# Patient Record
Sex: Female | Born: 1960 | Race: White | Hispanic: No | Marital: Single | Smoking: Never smoker
Health system: Southern US, Community
[De-identification: ages and names within clinical notes are randomized; demographics above are authoritative.]

## PROBLEM LIST (undated history)

## (undated) DIAGNOSIS — I10 Essential (primary) hypertension: Secondary | ICD-10-CM

## (undated) DIAGNOSIS — E669 Obesity, unspecified: Secondary | ICD-10-CM

## (undated) DIAGNOSIS — K219 Gastro-esophageal reflux disease without esophagitis: Secondary | ICD-10-CM

## (undated) HISTORY — PX: MEDIAL PARTIAL KNEE REPLACEMENT: SHX5965

## (undated) HISTORY — PX: LAPAROSCOPIC GASTRIC BANDING: SHX1100

## (undated) HISTORY — DX: Obesity, unspecified: E66.9

---

## 2001-01-12 ENCOUNTER — Other Ambulatory Visit: Admission: RE | Admit: 2001-01-12 | Discharge: 2001-01-12 | Payer: Self-pay | Admitting: Family Medicine

## 2003-08-15 ENCOUNTER — Other Ambulatory Visit: Admission: RE | Admit: 2003-08-15 | Discharge: 2003-08-15 | Payer: Self-pay | Admitting: Family Medicine

## 2004-10-21 ENCOUNTER — Other Ambulatory Visit: Admission: RE | Admit: 2004-10-21 | Discharge: 2004-10-21 | Payer: Self-pay | Admitting: Family Medicine

## 2005-10-28 ENCOUNTER — Other Ambulatory Visit: Admission: RE | Admit: 2005-10-28 | Discharge: 2005-10-28 | Payer: Self-pay | Admitting: Family Medicine

## 2006-01-07 ENCOUNTER — Ambulatory Visit (HOSPITAL_COMMUNITY): Admission: RE | Admit: 2006-01-07 | Discharge: 2006-01-07 | Payer: Self-pay | Admitting: General Surgery

## 2006-01-12 ENCOUNTER — Encounter: Admission: RE | Admit: 2006-01-12 | Discharge: 2006-01-12 | Payer: Self-pay | Admitting: General Surgery

## 2006-01-23 ENCOUNTER — Ambulatory Visit (HOSPITAL_COMMUNITY): Admission: RE | Admit: 2006-01-23 | Discharge: 2006-01-23 | Payer: Self-pay | Admitting: General Surgery

## 2006-05-11 ENCOUNTER — Encounter: Admission: RE | Admit: 2006-05-11 | Discharge: 2006-08-09 | Payer: Self-pay | Admitting: General Surgery

## 2006-05-26 ENCOUNTER — Ambulatory Visit (HOSPITAL_COMMUNITY): Admission: RE | Admit: 2006-05-26 | Discharge: 2006-05-27 | Payer: Self-pay | Admitting: General Surgery

## 2006-08-25 ENCOUNTER — Encounter: Admission: RE | Admit: 2006-08-25 | Discharge: 2006-11-23 | Payer: Self-pay | Admitting: General Surgery

## 2006-11-24 ENCOUNTER — Other Ambulatory Visit: Admission: RE | Admit: 2006-11-24 | Discharge: 2006-11-24 | Payer: Self-pay | Admitting: Obstetrics and Gynecology

## 2007-06-09 ENCOUNTER — Encounter: Admission: RE | Admit: 2007-06-09 | Discharge: 2007-06-09 | Payer: Self-pay | Admitting: Family Medicine

## 2007-12-27 ENCOUNTER — Other Ambulatory Visit: Admission: RE | Admit: 2007-12-27 | Discharge: 2007-12-27 | Payer: Self-pay | Admitting: Family Medicine

## 2008-02-01 ENCOUNTER — Encounter: Admission: RE | Admit: 2008-02-01 | Discharge: 2008-02-01 | Payer: Self-pay | Admitting: Obstetrics and Gynecology

## 2008-02-04 ENCOUNTER — Encounter: Admission: RE | Admit: 2008-02-04 | Discharge: 2008-02-04 | Payer: Self-pay | Admitting: Interventional Radiology

## 2009-01-23 ENCOUNTER — Other Ambulatory Visit: Admission: RE | Admit: 2009-01-23 | Discharge: 2009-01-23 | Payer: Self-pay | Admitting: Obstetrics and Gynecology

## 2009-08-29 ENCOUNTER — Ambulatory Visit (HOSPITAL_COMMUNITY): Admission: RE | Admit: 2009-08-29 | Discharge: 2009-08-29 | Payer: Self-pay | Admitting: Interventional Radiology

## 2009-09-03 ENCOUNTER — Ambulatory Visit (HOSPITAL_COMMUNITY): Admission: RE | Admit: 2009-09-03 | Discharge: 2009-09-04 | Payer: Self-pay | Admitting: Interventional Radiology

## 2009-10-02 ENCOUNTER — Encounter: Admission: RE | Admit: 2009-10-02 | Discharge: 2009-10-02 | Payer: Self-pay | Admitting: Interventional Radiology

## 2010-09-01 ENCOUNTER — Encounter: Payer: Self-pay | Admitting: Family Medicine

## 2010-09-02 ENCOUNTER — Encounter: Payer: Self-pay | Admitting: Interventional Radiology

## 2010-09-20 ENCOUNTER — Other Ambulatory Visit: Payer: Self-pay | Admitting: Family Medicine

## 2010-09-20 DIAGNOSIS — Z1231 Encounter for screening mammogram for malignant neoplasm of breast: Secondary | ICD-10-CM

## 2010-10-10 ENCOUNTER — Ambulatory Visit
Admission: RE | Admit: 2010-10-10 | Discharge: 2010-10-10 | Disposition: A | Discharge status: Home / Self Care | Payer: BC Managed Care – PPO | Source: Ambulatory Visit | Attending: Family Medicine | Admitting: Family Medicine

## 2010-10-10 DIAGNOSIS — Z1231 Encounter for screening mammogram for malignant neoplasm of breast: Secondary | ICD-10-CM

## 2010-10-18 ENCOUNTER — Other Ambulatory Visit (HOSPITAL_COMMUNITY)
Admission: RE | Admit: 2010-10-18 | Discharge: 2010-10-18 | Disposition: A | Discharge status: Home / Self Care | Payer: BC Managed Care – PPO | Source: Ambulatory Visit | Attending: Obstetrics and Gynecology | Admitting: Obstetrics and Gynecology

## 2010-10-18 ENCOUNTER — Other Ambulatory Visit: Payer: Self-pay | Admitting: Obstetrics and Gynecology

## 2010-10-18 DIAGNOSIS — Z01419 Encounter for gynecological examination (general) (routine) without abnormal findings: Secondary | ICD-10-CM | POA: Insufficient documentation

## 2010-10-27 LAB — CREATININE, SERUM
Creatinine, Ser: 0.79 mg/dL (ref 0.4–1.2)
GFR calc Af Amer: >60 mL/min (ref >60)
GFR calc non Af Amer: >60 mL/min (ref >60)

## 2010-10-27 LAB — CBC
Hemoglobin: 12.2 g/dL (ref 12.0–15.0)
Platelets: 310 10*3/uL (ref 150–400)
RDW: 15 % (ref 11.5–15.5)

## 2010-12-27 NOTE — Op Note (Signed)
[name], [name] NO.:  [id number]   MEDICAL RECORD NO.:  [id number]          PATIENT TYPE:  OIB   LOCATION:  1505                         FACILITY:  [facility]   PHYSICIAN:  Sharlet Salina T. Hoxworth, M.D.DATE OF BIRTH:  [birth year]   DATE OF PROCEDURE:  05/26/2006  DATE OF DISCHARGE:                               OPERATIVE REPORT   PRE AND POSTOPERATIVE DIAGNOSIS:  Morbid obesity.   SURGICAL PROCEDURES:  Placement laparoscopic adjustable gastric bands.   SURGEON:  Lorne Skeens. Hoxworth, M.D.   ASSISTANT:  Dr. Baruch Merl   ANESTHESIA:  General.   BRIEF HISTORY:  Michaela Kennedy is a 42 old female with progressive morbid  obesity unresponsive to medical management and several comorbidities  including hypertension, chronic joint pain, possible early diabetes.  Following extensive preoperative evaluation and discussion detailed  elsewhere, we have elected to proceed with placement laparoscopic  adjustable gastric band.  She is brought to the operating room for this  procedure.   DESCRIPTION OF PROCEDURE:  The patient was brought to the operating room  placed supine position operating table and general orotracheal  anesthesia was induced.  She received preoperative IV heparin.  PAS were  placed.  The abdomen was widely sterilely prepped and draped.  She  received preoperative IV antibiotics.  Correct patient and procedure  were verified.  Access was obtained without difficulty with a 11 mm  OptiVu trocar in the left subcostal space and pneumoperitoneum  established.  Under direct vision a 15 mm trocar was placed in the right  subxiphoid area and 11 mm trocar in the right midabdomen, 11 mm trocar  in the left abdomen just left to midline for the camera port and a 5 mm  trocar in the left flank.  Through a 5-mm site in the subxiphoid area,  the Nathanson retractor was placed and left lobe of liver elevated with  excellent exposure of the upper abdomen and hiatus.  The  angle of His  was exposed and the peritoneum overlying the left crus was incised and  dissection carefully carried down along the left crus toward the  retrogastric area bluntly with the finger dissector.  Following this the  gastrohepatic omentum was opened in an avascular space and the right  crus identified and a point along its medial edge that was crossing fat  was incised with hook cautery and careful blunt dissection of the finger  dissector was used to enter the retrogastric space and the finger  dissection deployed up to the previously dissected area of the angle of  His without difficulty.  A flushed APS lap band system was introduced  through the 15 mm trocar which had been placed with finger tractor which  was then brought back behind the stomach and the band was brought back  behind the stomach without difficulty.  With the calibration tube in  place, the tubing was placed through the band and the band buckled  without undue tightness.  The calibration tube was removed.  The fundus  beginning near the angle of his was imbricated up over the band to  the  small gastric pouch with three interrupted 2-0 Ethibond sutures.  The  band was seen to rotate without undue tension following this with the  buckle well away from the imbrication.  Following this the liver  retractor was removed.  The tubing brought out through the right mid  abdominal trocar site.  All CO2 evacuated.  Trocars removed.  The  incision in the right mid abdominal trocar site was extended somewhat  laterally.  The anterior fascia exposed and four 2-0 Prolene sutures  placed.  The tubing was cut, the port attached and then sutured to the  anterior abdominal wall with the previously placed sutures.  The tubing  was seen to curve nicely through the  trocar site.  The wounds were irrigated.  The subcu was closed at the  port site with running 2-0 Vicryl.  Skin was closed with staples.  Sponge, needle and instrument  counts were correct.  Dry sterile  dressings were applied.  The patient taken to recovery in good  condition.      Lorne Skeens. Hoxworth, M.D.  Electronically Signed     BTH/MEDQ  D:  05/26/2006  T:  05/27/2006  Job:  [id number]

## 2011-01-29 ENCOUNTER — Emergency Department (INDEPENDENT_AMBULATORY_CARE_PROVIDER_SITE_OTHER): Payer: BC Managed Care – PPO

## 2011-01-29 ENCOUNTER — Emergency Department (HOSPITAL_BASED_OUTPATIENT_CLINIC_OR_DEPARTMENT_OTHER)
Admission: EM | Admit: 2011-01-29 | Discharge: 2011-01-29 | Disposition: A | Discharge status: Home / Self Care | Payer: BC Managed Care – PPO | Attending: Emergency Medicine | Admitting: Emergency Medicine

## 2011-01-29 DIAGNOSIS — R7309 Other abnormal glucose: Secondary | ICD-10-CM | POA: Insufficient documentation

## 2011-01-29 DIAGNOSIS — R509 Fever, unspecified: Secondary | ICD-10-CM | POA: Insufficient documentation

## 2011-01-29 DIAGNOSIS — I10 Essential (primary) hypertension: Secondary | ICD-10-CM | POA: Insufficient documentation

## 2011-01-29 LAB — COMPREHENSIVE METABOLIC PANEL
ALT: 9 U/L (ref 0–35)
AST: 12 U/L (ref 0–37)
Calcium: 8.8 mg/dL (ref 8.4–10.5)
Creatinine, Ser: 0.7 mg/dL (ref 0.50–1.10)
GFR calc Af Amer: >60 mL/min (ref >60)
Glucose, Bld: 180 mg/dL — ABNORMAL HIGH (ref 70–99)
Sodium: 135 mEq/L (ref 135–145)
Total Protein: 7 g/dL (ref 6.0–8.3)

## 2011-01-29 LAB — WET PREP, GENITAL
Clue Cells Wet Prep HPF POC: NONE SEEN
Trich, Wet Prep: NONE SEEN
Yeast Wet Prep HPF POC: NONE SEEN

## 2011-01-29 LAB — URINALYSIS, ROUTINE W REFLEX MICROSCOPIC
Ketones, ur: NEGATIVE mg/dL
Leukocytes, UA: NEGATIVE
Protein, ur: NEGATIVE mg/dL
Urobilinogen, UA: 0.2 mg/dL (ref 0.0–1.0)

## 2011-01-29 LAB — DIFFERENTIAL
Basophils Absolute: 0 10*3/uL (ref 0.0–0.1)
Basophils Relative: 0 % (ref 0–1)
Eosinophils Absolute: 0.1 10*3/uL (ref 0.0–0.7)
Eosinophils Relative: 1 % (ref 0–5)
Monocytes Absolute: 0.8 10*3/uL (ref 0.1–1.0)
Neutro Abs: 7.9 10*3/uL — ABNORMAL HIGH (ref 1.7–7.7)

## 2011-01-29 LAB — CBC
MCHC: 34.1 g/dL (ref 30.0–36.0)
RDW: 12.5 % (ref 11.5–15.5)

## 2011-01-30 LAB — GC/CHLAMYDIA PROBE AMP, GENITAL: Chlamydia, DNA Probe: NEGATIVE

## 2011-02-03 ENCOUNTER — Emergency Department (HOSPITAL_BASED_OUTPATIENT_CLINIC_OR_DEPARTMENT_OTHER)
Admission: EM | Admit: 2011-02-03 | Discharge: 2011-02-03 | Disposition: A | Discharge status: Home / Self Care | Payer: BC Managed Care – PPO | Attending: Emergency Medicine | Admitting: Emergency Medicine

## 2011-02-03 DIAGNOSIS — R509 Fever, unspecified: Secondary | ICD-10-CM | POA: Insufficient documentation

## 2011-02-03 DIAGNOSIS — I10 Essential (primary) hypertension: Secondary | ICD-10-CM | POA: Insufficient documentation

## 2011-02-03 LAB — URINALYSIS, ROUTINE W REFLEX MICROSCOPIC
Glucose, UA: NEGATIVE mg/dL
Hgb urine dipstick: NEGATIVE
Ketones, ur: NEGATIVE mg/dL
Protein, ur: NEGATIVE mg/dL
pH: 5.5 (ref 5.0–8.0)

## 2011-02-03 LAB — URINE MICROSCOPIC-ADD ON

## 2011-02-05 LAB — CULTURE, BLOOD (ROUTINE X 2)
Culture  Setup Time: 201206210225
Culture: NO GROWTH

## 2011-02-05 LAB — ROCKY MTN SPOTTED FVR AB, IGG-BLOOD: RMSF IgG: 0.7 IV

## 2011-02-05 LAB — ROCKY MTN SPOTTED FVR AB, IGM-BLOOD: RMSF IgM: 0.12 IV (ref 0.00–0.89)

## 2011-02-14 ENCOUNTER — Emergency Department (HOSPITAL_BASED_OUTPATIENT_CLINIC_OR_DEPARTMENT_OTHER)
Admission: EM | Admit: 2011-02-14 | Discharge: 2011-02-14 | Disposition: A | Discharge status: Home / Self Care | Payer: BC Managed Care – PPO | Attending: Emergency Medicine | Admitting: Emergency Medicine

## 2011-02-14 DIAGNOSIS — T783XXA Angioneurotic edema, initial encounter: Secondary | ICD-10-CM | POA: Insufficient documentation

## 2011-02-14 DIAGNOSIS — D649 Anemia, unspecified: Secondary | ICD-10-CM | POA: Insufficient documentation

## 2011-02-14 DIAGNOSIS — X58XXXA Exposure to other specified factors, initial encounter: Secondary | ICD-10-CM | POA: Insufficient documentation

## 2011-02-14 DIAGNOSIS — I10 Essential (primary) hypertension: Secondary | ICD-10-CM | POA: Insufficient documentation

## 2011-02-14 LAB — CBC
HCT: 27 % — ABNORMAL LOW (ref 36.0–46.0)
Hemoglobin: 9.2 g/dL — ABNORMAL LOW (ref 12.0–15.0)
MCHC: 34.1 g/dL (ref 30.0–36.0)
RDW: 12.6 % (ref 11.5–15.5)
WBC: 10 10*3/uL (ref 4.0–10.5)

## 2011-02-14 LAB — BASIC METABOLIC PANEL
Chloride: 96 mEq/L (ref 96–112)
GFR calc Af Amer: >60 mL/min (ref >60)
GFR calc non Af Amer: >60 mL/min (ref >60)
Potassium: 3.6 mEq/L (ref 3.5–5.1)
Sodium: 133 mEq/L — ABNORMAL LOW (ref 135–145)

## 2011-02-23 ENCOUNTER — Encounter (HOSPITAL_BASED_OUTPATIENT_CLINIC_OR_DEPARTMENT_OTHER): Payer: Self-pay | Admitting: Emergency Medicine

## 2011-02-23 ENCOUNTER — Emergency Department (HOSPITAL_BASED_OUTPATIENT_CLINIC_OR_DEPARTMENT_OTHER)
Admission: EM | Admit: 2011-02-23 | Discharge: 2011-02-23 | Disposition: A | Discharge status: Home / Self Care | Payer: BC Managed Care – PPO | Attending: Emergency Medicine | Admitting: Emergency Medicine

## 2011-02-23 ENCOUNTER — Emergency Department (HOSPITAL_BASED_OUTPATIENT_CLINIC_OR_DEPARTMENT_OTHER): Payer: BC Managed Care – PPO

## 2011-02-23 DIAGNOSIS — N9489 Other specified conditions associated with female genital organs and menstrual cycle: Secondary | ICD-10-CM | POA: Insufficient documentation

## 2011-02-23 DIAGNOSIS — B9689 Other specified bacterial agents as the cause of diseases classified elsewhere: Secondary | ICD-10-CM | POA: Insufficient documentation

## 2011-02-23 DIAGNOSIS — A499 Bacterial infection, unspecified: Secondary | ICD-10-CM | POA: Insufficient documentation

## 2011-02-23 DIAGNOSIS — I10 Essential (primary) hypertension: Secondary | ICD-10-CM | POA: Insufficient documentation

## 2011-02-23 DIAGNOSIS — N760 Acute vaginitis: Secondary | ICD-10-CM | POA: Insufficient documentation

## 2011-02-23 DIAGNOSIS — N898 Other specified noninflammatory disorders of vagina: Secondary | ICD-10-CM

## 2011-02-23 DIAGNOSIS — N390 Urinary tract infection, site not specified: Secondary | ICD-10-CM

## 2011-02-23 DIAGNOSIS — R109 Unspecified abdominal pain: Secondary | ICD-10-CM | POA: Insufficient documentation

## 2011-02-23 HISTORY — DX: Essential (primary) hypertension: I10

## 2011-02-23 LAB — URINALYSIS, ROUTINE W REFLEX MICROSCOPIC
Nitrite: NEGATIVE
Protein, ur: 30 mg/dL — AB
Specific Gravity, Urine: 1.029 (ref 1.005–1.030)
Urobilinogen, UA: 1 mg/dL (ref 0.0–1.0)

## 2011-02-23 LAB — URINE MICROSCOPIC-ADD ON

## 2011-02-23 LAB — WET PREP, GENITAL

## 2011-02-23 MED ORDER — ACETAMINOPHEN 500 MG PO TABS
1000.0000 mg | ORAL_TABLET | Freq: Once | ORAL | Status: AC
  Administered 2011-02-23: 1000 mg via ORAL

## 2011-02-23 MED ORDER — CIPROFLOXACIN HCL 500 MG PO TABS: 500.0000 mg | ORAL_TABLET | Freq: Two times a day (BID) | ORAL | Status: AC

## 2011-02-23 MED ORDER — ACETAMINOPHEN 500 MG PO TABS
ORAL_TABLET | ORAL | Status: AC
  Administered 2011-02-23: 1000 mg via ORAL
  Filled 2011-02-23: qty 2

## 2011-02-23 MED ORDER — METRONIDAZOLE 500 MG PO TABS: 500.0000 mg | ORAL_TABLET | Freq: Two times a day (BID) | ORAL | Status: AC

## 2011-02-23 NOTE — ED Notes (Signed)
PA & MD unable to collect GC/chlamydia.  Wet Prep collected and sent to lab.

## 2011-02-23 NOTE — ED Notes (Signed)
Pt is requesting tylenol for pain.  Provider notified.

## 2011-02-23 NOTE — ED Notes (Signed)
Pt informed that she may get dressed, discharge pending

## 2011-02-23 NOTE — ED Notes (Signed)
PA to do pelvic on Pt. With assisstance from EMT Nicholos Johns

## 2011-02-23 NOTE — ED Notes (Signed)
PCP Sherrine Maples, NP

## 2011-02-24 ENCOUNTER — Other Ambulatory Visit (HOSPITAL_BASED_OUTPATIENT_CLINIC_OR_DEPARTMENT_OTHER): Payer: BC Managed Care – PPO

## 2011-02-24 ENCOUNTER — Ambulatory Visit (HOSPITAL_BASED_OUTPATIENT_CLINIC_OR_DEPARTMENT_OTHER): Admit: 2011-02-24 | Payer: BC Managed Care – PPO

## 2011-02-24 ENCOUNTER — Ambulatory Visit (HOSPITAL_BASED_OUTPATIENT_CLINIC_OR_DEPARTMENT_OTHER): Payer: BC Managed Care – PPO

## 2011-02-24 ENCOUNTER — Ambulatory Visit (HOSPITAL_BASED_OUTPATIENT_CLINIC_OR_DEPARTMENT_OTHER): Admission: RE | Admit: 2011-02-24 | Payer: BC Managed Care – PPO | Source: Ambulatory Visit

## 2011-02-24 NOTE — ED Provider Notes (Signed)
History   HPI Patient is a 50 y.o. female presenting with abdominal pain.  Abdominal Pain The primary symptoms of the illness include abdominal pain. The primary symptoms of the illness do not include fever, nausea, vomiting, diarrhea, dysuria, vaginal discharge or vaginal bleeding. Primary symptoms comment: vaginal mass  Symptoms associated with the illness do not include anorexia, constipation, urgency, hematuria, frequency or back pain.  Pt describes pain as cramping. States pain is constant. Reports today a mass came out of her vagina. States she tried to pull it out but it was stuck. States mass and vagina are not tender. Denies vaginal bleeding.   Past Medical History  Diagnosis Date  . Hypertension     Past Surgical History  Procedure Date  . Laparoscopic gastric banding     No family history on file.  History  Substance Use Topics  . Smoking status: Never Smoker   . Smokeless tobacco: Not on file  . Alcohol Use: Yes     occ    OB History    Grav Para Term Preterm Abortions TAB SAB Ect Mult Living                  Review of Systems  Constitutional: Negative for fever.  Gastrointestinal: Positive for abdominal pain. Negative for nausea, vomiting, diarrhea, constipation and anorexia.       Vaginal mass   Genitourinary: Negative for dysuria, urgency, frequency, hematuria, vaginal bleeding and vaginal discharge.  Musculoskeletal: Negative for back pain.    Physical Exam  BP 152/86  Pulse 90  Temp(Src) 98.6 F (37 C) (Oral)  Resp 20  SpO2 97%  LMP 01/19/2011  Physical Exam  Constitutional: She is oriented to person, place, and time. She appears well-developed and well-nourished.  HENT:  Head: Atraumatic.  Eyes: Conjunctivae and EOM are normal. Pupils are equal, round, and reactive to light.  Neck: Normal range of motion. Neck supple.  Cardiovascular: Normal rate, regular rhythm and normal heart sounds.  Exam reveals no friction rub.   No murmur  heard. Pulmonary/Chest: Effort normal and breath sounds normal. She has no wheezes. She has no rales. She exhibits no tenderness.  Abdominal: Soft. Bowel sounds are normal. She exhibits no distension and no mass. There is no tenderness. There is no rebound and no guarding.  Genitourinary:    There is no tenderness or lesion on the right labia. There is no tenderness or lesion on the left labia. No tenderness around the vagina.       Large soft tissue mass protruding from within the vagina. Mass is pale, nontender. Unable to removed with ring forceps. Unable to visualize cervix due to size of mass. Mass is not a vaginal or rectal prolapse. Does not apear to be a fibroid.  Musculoskeletal: Normal range of motion.  Neurological: She is alert and oriented to person, place, and time. Coordination normal.  Skin: Skin is warm and dry. No rash noted. No erythema. No pallor.    ED Course  Pelvic exam Performed by: Thomasene Lot Authorized by: Thomasene Lot Consent: Verbal consent obtained. Consent given by: patient Patient understanding: patient states understanding of the procedure being performed Time out: Immediately prior to procedure a "time out" was called to verify the correct patient, procedure, equipment, support staff and site/side marked as required. Preparation: Patient was prepped and draped in the usual sterile fashion. Local anesthesia used: no Patient sedated: no Patient tolerance: Patient tolerated the procedure well with no immediate complications. Comments: Unable to  visualize the cervix due to large vaginal mass. Mass is unattached to vaginal walls. Potentially a pedunculated. No vaginal discharge or bleeding. External genitalia normal.     MDM Scheduled an outpatient [place] for further evaluation of mass. Suspect a pedunculated fibroid may have come out of the uterus. Therefore causing abdominal cramping. Pelvic exam on 01/29/2011 was did not indicate a vaginal mass. DDx  also include a cancerous tumor. I have strongly advised for close follow-up with her OB/GYN. Patient agrees with plan. [place] tech has spoken with her and patient is ready for discharge.       Thomasene Lot, [place] 02/26/11 1104

## 2011-02-25 ENCOUNTER — Other Ambulatory Visit: Payer: Self-pay | Admitting: Obstetrics and Gynecology

## 2011-02-25 ENCOUNTER — Encounter (HOSPITAL_COMMUNITY): Payer: Self-pay | Admitting: *Deleted

## 2011-02-25 ENCOUNTER — Ambulatory Visit (HOSPITAL_COMMUNITY): Payer: BC Managed Care – PPO | Admitting: Anesthesiology

## 2011-02-25 ENCOUNTER — Encounter (HOSPITAL_COMMUNITY): Payer: Self-pay | Admitting: Anesthesiology

## 2011-02-25 ENCOUNTER — Encounter (HOSPITAL_COMMUNITY)
Admission: RE | Disposition: A | Discharge status: Home / Self Care | Payer: Self-pay | Source: Ambulatory Visit | Attending: Obstetrics and Gynecology

## 2011-02-25 ENCOUNTER — Inpatient Hospital Stay (HOSPITAL_COMMUNITY)
Admission: RE | Admit: 2011-02-25 | Discharge: 2011-02-27 | DRG: 359 | Disposition: A | Discharge status: Home / Self Care | Payer: BC Managed Care – PPO | Source: Ambulatory Visit | Attending: Obstetrics and Gynecology | Admitting: Obstetrics and Gynecology

## 2011-02-25 DIAGNOSIS — D259 Leiomyoma of uterus, unspecified: Principal | ICD-10-CM

## 2011-02-25 DIAGNOSIS — I10 Essential (primary) hypertension: Secondary | ICD-10-CM

## 2011-02-25 DIAGNOSIS — IMO0002 Reserved for concepts with insufficient information to code with codable children: Secondary | ICD-10-CM | POA: Diagnosis not present

## 2011-02-25 DIAGNOSIS — Z90710 Acquired absence of both cervix and uterus: Secondary | ICD-10-CM

## 2011-02-25 DIAGNOSIS — Y9210 Unspecified residential institution as the place of occurrence of the external cause: Secondary | ICD-10-CM | POA: Diagnosis not present

## 2011-02-25 HISTORY — PX: ABDOMINAL HYSTERECTOMY: SHX81

## 2011-02-25 LAB — CBC
MCHC: 32.4 g/dL (ref 30.0–36.0)
MCV: 87.9 fL (ref 78.0–100.0)
Platelets: 342 10*3/uL (ref 150–400)
RDW: 15.5 % (ref 11.5–15.5)
WBC: 7.2 10*3/uL (ref 4.0–10.5)

## 2011-02-25 LAB — URINE MICROSCOPIC-ADD ON

## 2011-02-25 LAB — URINALYSIS, ROUTINE W REFLEX MICROSCOPIC
Bilirubin Urine: NEGATIVE
Ketones, ur: NEGATIVE mg/dL
Nitrite: NEGATIVE
Protein, ur: NEGATIVE mg/dL
Specific Gravity, Urine: 1.02 (ref 1.005–1.030)
Urobilinogen, UA: 0.2 mg/dL (ref 0.0–1.0)

## 2011-02-25 LAB — SURGICAL PCR SCREEN
MRSA, PCR: NEGATIVE
Staphylococcus aureus: NEGATIVE

## 2011-02-25 LAB — URINE CULTURE: Culture  Setup Time: 201207152052

## 2011-02-25 LAB — ABO/RH: ABO/RH(D): O POS

## 2011-02-25 SURGERY — HYSTERECTOMY, ABDOMINAL
Anesthesia: Choice | Site: Abdomen | Wound class: Clean Contaminated

## 2011-02-25 MED ORDER — PROMETHAZINE HCL 25 MG/ML IJ SOLN: 12.5000 mg | INTRAMUSCULAR | Status: DC | PRN

## 2011-02-25 MED ORDER — MIDAZOLAM HCL 5 MG/5ML IJ SOLN
INTRAMUSCULAR | Status: DC | PRN
  Administered 2011-02-25: 2 mg via INTRAVENOUS

## 2011-02-25 MED ORDER — MIDAZOLAM HCL 2 MG/2ML IJ SOLN
INTRAMUSCULAR | Status: AC
  Filled 2011-02-25: qty 2

## 2011-02-25 MED ORDER — LIDOCAINE HCL (CARDIAC) 20 MG/ML IV SOLN
INTRAVENOUS | Status: DC | PRN
  Administered 2011-02-25: 100 mg via INTRAVENOUS

## 2011-02-25 MED ORDER — FENTANYL CITRATE 0.05 MG/ML IJ SOLN
INTRAMUSCULAR | Status: AC
  Filled 2011-02-25: qty 5

## 2011-02-25 MED ORDER — CEFAZOLIN SODIUM-DEXTROSE 2-3 GM-% IV SOLR: 2.0000 g | INTRAVENOUS | Status: DC

## 2011-02-25 MED ORDER — FENTANYL CITRATE 0.05 MG/ML IJ SOLN
INTRAMUSCULAR | Status: DC | PRN
  Administered 2011-02-25: 50 ug
  Administered 2011-02-25: 100 ug via INTRAVENOUS

## 2011-02-25 MED ORDER — LIDOCAINE HCL (CARDIAC) 20 MG/ML IV SOLN
INTRAVENOUS | Status: AC
  Filled 2011-02-25: qty 5

## 2011-02-25 MED ORDER — INDIGOTINDISULFONATE SODIUM 8 MG/ML IJ SOLN
INTRAMUSCULAR | Status: DC | PRN
  Administered 2011-02-25: 5 mL via INTRAVENOUS

## 2011-02-25 MED ORDER — ESMOLOL HCL 10 MG/ML IV SOLN
INTRAVENOUS | Status: DC | PRN
  Administered 2011-02-25 (×5): 20 mg via INTRAVENOUS

## 2011-02-25 MED ORDER — HYDROMORPHONE HCL 1 MG/ML IJ SOLN
INTRAMUSCULAR | Status: AC
  Administered 2011-02-25: 0.5 mg via INTRAVENOUS
  Filled 2011-02-25: qty 1

## 2011-02-25 MED ORDER — GLYCOPYRROLATE 0.2 MG/ML IJ SOLN
INTRAMUSCULAR | Status: DC | PRN
  Administered 2011-02-25: .8 mg via INTRAVENOUS

## 2011-02-25 MED ORDER — HYDROMORPHONE HCL 1 MG/ML IJ SOLN
INTRAMUSCULAR | Status: DC | PRN
  Administered 2011-02-25 (×2): 1 mg via INTRAVENOUS

## 2011-02-25 MED ORDER — STERILE WATER FOR IRRIGATION IR SOLN
Status: DC | PRN
  Administered 2011-02-25: 1000 mL

## 2011-02-25 MED ORDER — ONDANSETRON HCL 4 MG/2ML IJ SOLN
INTRAMUSCULAR | Status: AC
  Filled 2011-02-25: qty 2

## 2011-02-25 MED ORDER — HYDROMORPHONE HCL 1 MG/ML IJ SOLN
0.2500 mg | INTRAMUSCULAR | Status: DC | PRN
  Administered 2011-02-25 (×2): 0.5 mg via INTRAVENOUS

## 2011-02-25 MED ORDER — ROCURONIUM BROMIDE 50 MG/5ML IV SOLN
INTRAVENOUS | Status: AC
  Filled 2011-02-25: qty 1

## 2011-02-25 MED ORDER — HYDROMORPHONE HCL 1 MG/ML IJ SOLN
INTRAMUSCULAR | Status: AC
  Filled 2011-02-25: qty 1

## 2011-02-25 MED ORDER — ESMOLOL HCL 10 MG/ML IV SOLN
INTRAVENOUS | Status: AC
  Filled 2011-02-25: qty 10

## 2011-02-25 MED ORDER — LACTATED RINGERS IV SOLN
INTRAVENOUS | Status: DC
  Administered 2011-02-25 (×4): via INTRAVENOUS

## 2011-02-25 MED ORDER — PROPOFOL 10 MG/ML IV EMUL
INTRAVENOUS | Status: DC | PRN
  Administered 2011-02-25: 200 mg via INTRAVENOUS

## 2011-02-25 MED ORDER — LACTATED RINGERS IV SOLN
INTRAVENOUS | Status: DC
  Administered 2011-02-26: 01:00:00 via INTRAVENOUS

## 2011-02-25 MED ORDER — NEOSTIGMINE METHYLSULFATE 1 MG/ML IJ SOLN
INTRAMUSCULAR | Status: AC
  Filled 2011-02-25: qty 10

## 2011-02-25 MED ORDER — CEFAZOLIN SODIUM-DEXTROSE 2-3 GM-% IV SOLR
2.0000 g | INTRAVENOUS | Status: AC
  Administered 2011-02-25: 2 g via INTRAVENOUS
  Filled 2011-02-25: qty 50

## 2011-02-25 MED ORDER — FENTANYL CITRATE 0.05 MG/ML IJ SOLN
INTRAMUSCULAR | Status: AC
  Administered 2011-02-25: 50 ug
  Filled 2011-02-25: qty 2

## 2011-02-25 MED ORDER — OXYCODONE-ACETAMINOPHEN 5-325 MG PO TABS
1.0000 | ORAL_TABLET | ORAL | Status: DC | PRN
  Administered 2011-02-26: 1 via ORAL
  Administered 2011-02-26: 2 via ORAL
  Administered 2011-02-26 (×3): 1 via ORAL
  Administered 2011-02-27 (×2): 2 via ORAL
  Filled 2011-02-25 (×2): qty 2
  Filled 2011-02-25: qty 1
  Filled 2011-02-25: qty 2
  Filled 2011-02-25 (×3): qty 1

## 2011-02-25 MED ORDER — HYDROMORPHONE 0.3 MG/ML IV SOLN
INTRAVENOUS | Status: AC
  Filled 2011-02-25: qty 25

## 2011-02-25 MED ORDER — DEXAMETHASONE SODIUM PHOSPHATE 10 MG/ML IJ SOLN
INTRAMUSCULAR | Status: AC
  Filled 2011-02-25: qty 1

## 2011-02-25 MED ORDER — INDIGOTINDISULFONATE SODIUM 8 MG/ML IJ SOLN
INTRAMUSCULAR | Status: AC
  Filled 2011-02-25: qty 5

## 2011-02-25 MED ORDER — MICROFIBRILLAR COLL HEMOSTAT EX POWD
CUTANEOUS | Status: DC | PRN
  Administered 2011-02-25: 1 g via TOPICAL

## 2011-02-25 MED ORDER — SENNOSIDES-DOCUSATE SODIUM 8.6-50 MG PO TABS: 2.0000 | ORAL_TABLET | Freq: Every day | ORAL | Status: DC | PRN

## 2011-02-25 MED ORDER — HYDROMORPHONE 0.3 MG/ML IV SOLN
INTRAVENOUS | Status: DC
  Administered 2011-02-25: 22:00:00 via INTRAVENOUS
  Administered 2011-02-26: 3 mg via INTRAVENOUS
  Administered 2011-02-26: 04:00:00 via INTRAVENOUS
  Administered 2011-02-26: 3 via INTRAVENOUS

## 2011-02-25 MED ORDER — FENTANYL CITRATE 0.05 MG/ML IJ SOLN
INTRAMUSCULAR | Status: AC
  Filled 2011-02-25: qty 2

## 2011-02-25 MED ORDER — IBUPROFEN 200 MG PO TABS: 200.0000 mg | ORAL_TABLET | Freq: Four times a day (QID) | ORAL | Status: DC | PRN

## 2011-02-25 MED ORDER — PROPOFOL 10 MG/ML IV EMUL
INTRAVENOUS | Status: AC
  Filled 2011-02-25: qty 20

## 2011-02-25 MED ORDER — DEXAMETHASONE SODIUM PHOSPHATE 10 MG/ML IJ SOLN
INTRAMUSCULAR | Status: DC | PRN
  Administered 2011-02-25: 10 mg via INTRAVENOUS

## 2011-02-25 MED ORDER — ROCURONIUM BROMIDE 100 MG/10ML IV SOLN
INTRAVENOUS | Status: DC | PRN
  Administered 2011-02-25: 5 mg via INTRAVENOUS
  Administered 2011-02-25: 10 mg via INTRAVENOUS
  Administered 2011-02-25: 5 mg via INTRAVENOUS
  Administered 2011-02-25: 50 mg via INTRAVENOUS

## 2011-02-25 MED ORDER — MUPIROCIN 2 % EX OINT
TOPICAL_OINTMENT | CUTANEOUS | Status: AC
  Filled 2011-02-25: qty 22

## 2011-02-25 MED ORDER — NEOSTIGMINE METHYLSULFATE 1 MG/ML IJ SOLN
INTRAMUSCULAR | Status: DC | PRN
  Administered 2011-02-25: 4 mg via INTRAMUSCULAR

## 2011-02-25 MED ORDER — GLYCOPYRROLATE 0.2 MG/ML IJ SOLN
INTRAMUSCULAR | Status: AC
  Filled 2011-02-25: qty 2

## 2011-02-25 MED ORDER — ONDANSETRON HCL 4 MG/2ML IJ SOLN
INTRAMUSCULAR | Status: DC | PRN
  Administered 2011-02-25: 4 mg via INTRAVENOUS

## 2011-02-25 MED ORDER — ONDANSETRON HCL 4 MG/2ML IJ SOLN: 4.0000 mg | Freq: Once | INTRAMUSCULAR | Status: DC | PRN

## 2011-02-25 MED ORDER — IBUPROFEN 600 MG PO TABS
600.0000 mg | ORAL_TABLET | Freq: Four times a day (QID) | ORAL | Status: DC | PRN
  Administered 2011-02-26 (×2): 600 mg via ORAL
  Filled 2011-02-25 (×2): qty 1

## 2011-02-25 MED ORDER — KETOROLAC TROMETHAMINE 30 MG/ML IJ SOLN: 15.0000 mg | Freq: Once | INTRAMUSCULAR | Status: DC | PRN

## 2011-02-25 MED ORDER — MEPERIDINE HCL 25 MG/ML IJ SOLN: 6.2500 mg | INTRAMUSCULAR | Status: DC | PRN

## 2011-02-25 MED ORDER — FENTANYL CITRATE 0.05 MG/ML IJ SOLN
INTRAMUSCULAR | Status: DC | PRN
  Administered 2011-02-25: 150 ug via INTRAVENOUS
  Administered 2011-02-25: 100 ug via INTRAVENOUS
  Administered 2011-02-25: 150 ug via INTRAVENOUS
  Administered 2011-02-25: 100 ug via INTRAVENOUS

## 2011-02-25 SURGICAL SUPPLY — 40 items
APPLICATOR COTTON TIP 6IN STRL (MISCELLANEOUS) ×1 IMPLANT
CANISTER SUCTION 2500CC (MISCELLANEOUS) ×2 IMPLANT
CLOTH BEACON ORANGE TIMEOUT ST (SAFETY) ×2 IMPLANT
CONT PATH 16OZ SNAP LID 3702 (MISCELLANEOUS) ×2 IMPLANT
DECANTER SPIKE VIAL GLASS SM (MISCELLANEOUS) IMPLANT
DRAPE CAMERA CLOSED 9X96 (DRAPES) ×1 IMPLANT
DRAPE UTILITY XL STRL (DRAPES) ×1 IMPLANT
GAUZE SPONGE 4X4 16PLY XRAY LF (GAUZE/BANDAGES/DRESSINGS) ×2 IMPLANT
GLOVE BIOGEL M 6.5 STRL (GLOVE) ×2 IMPLANT
GLOVE BIOGEL PI IND STRL 6.5 (GLOVE) ×2 IMPLANT
GLOVE BIOGEL PI INDICATOR 6.5 (GLOVE) ×2
GOWN BRE IMP SLV AUR LG STRL (GOWN DISPOSABLE) ×5 IMPLANT
GOWN BRE IMP SLV AUR XL STRL (GOWN DISPOSABLE) ×2 IMPLANT
NDL HYPO 25X1 1.5 SAFETY (NEEDLE) IMPLANT
NEEDLE HYPO 25X1 1.5 SAFETY (NEEDLE) IMPLANT
NS IRRIG 1000ML POUR BTL (IV SOLUTION) ×2 IMPLANT
PACK ABDOMINAL GYN (CUSTOM PROCEDURE TRAY) ×2 IMPLANT
PAD OB MATERNITY 4.3X12.25 (PERSONAL CARE ITEMS) ×2 IMPLANT
SET CYSTO W/LG BORE CLAMP LF (SET/KITS/TRAYS/PACK) ×1 IMPLANT
SPONGE LAP 18X18 X RAY DECT (DISPOSABLE) ×6 IMPLANT
STAPLER VISISTAT 35W (STAPLE) IMPLANT
SUT PDS AB 0 CT1 27 (SUTURE) ×6 IMPLANT
SUT PDS AB 0 CTX 60 (SUTURE) ×2 IMPLANT
SUT PDS AB 1 CTX 36 (SUTURE) IMPLANT
SUT VIC AB 0 CT1 18XCR BRD8 (SUTURE) IMPLANT
SUT VIC AB 0 CT1 27 (SUTURE)
SUT VIC AB 0 CT1 27XCR 8 STRN (SUTURE) IMPLANT
SUT VIC AB 0 CT1 36 (SUTURE) ×4 IMPLANT
SUT VIC AB 0 CT1 8-18 (SUTURE) ×4
SUT VIC AB 2-0 CT1 (SUTURE) IMPLANT
SUT VIC AB 2-0 CT1 27 (SUTURE) ×4
SUT VIC AB 2-0 CT1 TAPERPNT 27 (SUTURE) ×2 IMPLANT
SUT VIC AB 2-0 SH 27 (SUTURE) ×4
SUT VIC AB 2-0 SH 27XBRD (SUTURE) ×2 IMPLANT
SUT VIC AB 4-0 KS 27 (SUTURE) ×2 IMPLANT
SUT VICRYL 0 TIES 12 18 (SUTURE) ×2 IMPLANT
SYR CONTROL 10ML LL (SYRINGE) IMPLANT
TOWEL OR 17X24 6PK STRL BLUE (TOWEL DISPOSABLE) ×4 IMPLANT
TRAY FOLEY CATH 14FR (SET/KITS/TRAYS/PACK) ×2 IMPLANT
WATER STERILE IRR 1000ML POUR (IV SOLUTION) ×1 IMPLANT

## 2011-02-25 NOTE — Anesthesia Preprocedure Evaluation (Addendum)
Anesthesia Evaluation  General Assessment Comment  Reviewed: Allergy & Precautions, H&P , Patient's Chart, lab work & pertinent test results and reviewed documented beta blocker date and time   Airway Mallampati: I TM Distance: >3 FB Neck ROM: full    Dental No notable dental hx (+) Teeth Intact   Pulmonaryneg pulmonary ROS    clear to auscultation    Cardiovascular regular Normal   Neuro/PsychNegative Neurological ROS Negative Psych ROS  GI/Hepatic/Renal negative GI ROS, negative Liver ROS, and negative Renal ROS (+)       Endo/Other  Negative Endocrine ROS (+)   Abdominal   Musculoskeletal  Hematology negative hematology ROS (+)   Peds  Reproductive/Obstetrics negative OB ROS   Anesthesia Other Findings             Anesthesia Physical Anesthesia Plan  ASA: II  Anesthesia Plan: General   Post-op Pain Management:    Induction:   Airway Management Planned: Oral ETT  Additional Equipment:   Intra-op Plan:   Post-operative Plan: Extubation in OR  Informed Consent: I have reviewed the patients History and Physical, chart, labs and discussed the procedure including the risks, benefits and alternatives for the proposed anesthesia with the patient or authorized representative who has indicated his/her understanding and acceptance.   Dental Advisory Given and History available from chart only  Plan Discussed with: CRNA  Anesthesia Plan Comments:       Anesthesia Quick Evaluation

## 2011-02-25 NOTE — Op Note (Signed)
Hysterectomy Procedure Note  Indications: Prolapsed uterine fibroids    Pre-operative Diagnosis: 1 prolapsed uterine fibroids 2 anemia   Post-operative Diagnosis: same + 3 cystotomy    Operation: Supracervical abdominal hysterectomy, cystotomy repair and cystoscopy     Surgeon: Gerald Leitz J.   Assistants: Consuelo Pandy    Anesthesia: General endotracheal anesthesia  ASA Class:   Procedure Details  The patient was seen in the Holding Room. The risks, benefits, complications, treatment options, and expected outcomes were discussed with the patient.  The patient concurred with the proposed plan, giving informed consent.  The site of surgery properly noted/marked. The patient was taken to Operating Room # 3, identified as Michaela Kennedy and the procedure verified as Total abdominal hysterectomy. A Time Out was held and the above information confirmed.  After induction of anesthesia, the patient was draped and prepped in the usual sterile manner. Pt was placed in supine position after anesthesia and draped and prepped in the usual sterile manner. Foley catheter was placed.  A midline  incision was made and carried through the subcutaneous tissue to the fascia. Fascial incision was made and extended anteriorly and inferiorly. . The rectus muscles were separated.  What was thought to be the peritoneum was identified and entered sharply. Once entry was made this was discovered to actually be the bladder that was displaced anteriorly.. The cystotomy was repaired with 2.0 vicryl in a running fashion . A second layer of suture was used in a runing locked fashion.  The peritoneum was then identified grasped with 2 hemostats and entered sharply.   The findings below  were noted.  A balfor retractor was placed and bowel was packed away from the surgical site.   The round ligaments were identified, cut, and ligated with 0-Vicryl. The anterior peritoneal reflection was incised and the bladder was  dissected off the lower uterine segment. The retroperitoneal space was explored and the ureters were identified bilaterally. The right utero-ovarian ligament and proximal fallopian tube were grasped, cut, and suture ligated with 0-Vicryl. The left utero-ovarian ligament and proximal fallopian tube were grasped, cut and suture ligated with 0-Vicryl. Hemostasis  was observed. The uterine vessels were skeletonized, then clamped, cut and suture ligated with 0-Vicryl suture. Serial pedicles of the cardinalwere clamped, cut, and suture ligated with 0-Vicryl. Do to the prolapsing fibroid and dilated cervix a decision was made to excise the uterus from the cervix.  The uterus and fibroids were sent to pathology. The cerivical stump was reaaproximated with interrupted figure of eight sutures of 0-vicryl.  Lavage was carried out until clear. Hemostasis was observed. avatene was placed over the cervical stump.  Cystoscopy was performed with 70 degree scope.Larena Glassman carmine was expressed from both ureteral orifices. The cystoscope was then removed.   Attention was then returned to the abdomen wher all  Packing retractors and instruments were removed   The fascia was approximated with running sutures of double stranded 0 pds. Lavage was again carried out... The subcutaneous adipose tissue was reapproximate with 2-0 plain gut.  Hemostasis was observed. The skin was approximated with staples.  Instrument, sponge, and needle counts were correct prior to abdominal closure and at the conclusion of the case.   Findings: Multiple uterine fibroids.. Large prolapsed fibroid.. Dilated cervix. Normal fallopian tubes and ovaries... Estimated Blood Loss:  250 cc         Drains: foley          Total IV Fluids: 3200 ml  Specimens: uterus and fibroids          Implants none           Complications: cystotomy         Disposition: PACU - hemodynamically stable.         Condition: stable  Attending Attestation: I  performed the procedure.

## 2011-02-25 NOTE — Anesthesia Postprocedure Evaluation (Signed)
Vital signs stable Patient alert Pain and nausea are controlled No apparent anesthetic complications No follow up care needed 

## 2011-02-25 NOTE — Anesthesia Procedure Notes (Signed)
Procedures

## 2011-02-25 NOTE — Transfer of Care (Signed)
Immediate Anesthesia Transfer of Care Note  Patient: Michaela Kennedy  Procedure(s) Performed:  HYSTERECTOMY ABDOMINAL - Supercervical Hysterectomy with Cystoscopy  Patient Location: PACU  Anesthesia Type: General  Level of Consciousness: awake, alert  and oriented  Airway & Oxygen Therapy: Patient Spontanous Breathing and Patient connected to nasal cannula oxygen  Post-op Assessment: Report given to PACU RN, Post -op Vital signs reviewed and stable and Patient moving all extremities  Post vital signs: Reviewed and stable  Complications: No apparent anesthesia complications

## 2011-02-26 ENCOUNTER — Other Ambulatory Visit (HOSPITAL_COMMUNITY): Payer: BC Managed Care – PPO

## 2011-02-26 LAB — BASIC METABOLIC PANEL
CO2: 27 mEq/L (ref 19–32)
Chloride: 99 mEq/L (ref 96–112)
Glucose, Bld: 135 mg/dL — ABNORMAL HIGH (ref 70–99)
Potassium: 5.1 mEq/L (ref 3.5–5.1)
Sodium: 132 mEq/L — ABNORMAL LOW (ref 135–145)

## 2011-02-26 LAB — CBC
HCT: 28.4 % — ABNORMAL LOW (ref 36.0–46.0)
Hemoglobin: 9 g/dL — ABNORMAL LOW (ref 12.0–15.0)
MCH: 28 pg (ref 26.0–34.0)
MCV: 88.5 fL (ref 78.0–100.0)
RBC: 3.21 MIL/uL — ABNORMAL LOW (ref 3.87–5.11)

## 2011-02-26 MED ORDER — HYDROMORPHONE 0.3 MG/ML IV SOLN
INTRAVENOUS | Status: AC
  Filled 2011-02-26: qty 25

## 2011-02-26 MED ORDER — FERROUS SULFATE 325 (65 FE) MG PO TABS
325.0000 mg | ORAL_TABLET | Freq: Three times a day (TID) | ORAL | Status: DC
  Administered 2011-02-26 – 2011-02-27 (×3): 325 mg via ORAL
  Filled 2011-02-26 (×3): qty 1

## 2011-02-26 MED ORDER — HYDROCHLOROTHIAZIDE 25 MG PO TABS
25.0000 mg | ORAL_TABLET | Freq: Every day | ORAL | Status: DC
  Administered 2011-02-26 – 2011-02-27 (×2): 25 mg via ORAL
  Filled 2011-02-26 (×2): qty 1

## 2011-02-26 NOTE — Progress Notes (Signed)
UR chart review completed.  

## 2011-02-26 NOTE — Progress Notes (Signed)
Subjective: Patient reports tolerating PO. No nausea or emesis. No flatus    Objective: I have reviewed patient's vital signs, intake and output and labs.  General: alert, cooperative and no distress GI: soft, non-tender; bowel sounds normal; no masses,  no organomegaly and incision: clean, dry and intact Extremities: no edema, redness or tenderness in the calves or thighs   Assessment/Plan: Pod # 1 s/p supracervical hysterectomy/ cystotomy/ cystoscopy doing well  Saline lock iv hctz for hypertension  Encourage ambulation Start po pain medication   LOS: 1 day    Elric Tirado J. 02/26/2011, 8:34 AM

## 2011-02-27 MED ORDER — IBUPROFEN 600 MG PO TABS: 600.0000 mg | ORAL_TABLET | Freq: Four times a day (QID) | ORAL | Status: AC | PRN

## 2011-02-27 MED ORDER — OXYCODONE-ACETAMINOPHEN 5-325 MG PO TABS: 1.0000 | ORAL_TABLET | Freq: Four times a day (QID) | ORAL | Status: AC | PRN

## 2011-02-27 NOTE — Discharge Summary (Signed)
Physician Discharge Summary  Patient ID: Michaela Kennedy MRN: [id number] DOB/AGE: April 16, 1961 50 y.o.  Admit date: 02/25/2011 Discharge date: 02/27/2011  Admission Diagnoses:  Discharge Diagnoses:  Active Problems:  * No active hospital problems. *    Discharged Condition: good  Hospital Course:  Pt admited with prolapsing uterine fibroids and underwent supracervical hysterectomy/ cystotomy repair/ cystoscopy on 02/25/2011   Consults: none  Significant Diagnostic Studies:   Treatments: surgery: supracervical hyterectomy/ cystotomy/ cystoscopy   Discharge Exam: Blood pressure 148/94, pulse 71, temperature 98.7 F (37.1 C), temperature source Oral, resp. rate 18, height 5\' 4"  (1.626 m), weight 95.255 kg (210 lb), last menstrual period 01/19/2011, SpO2 96.00%. General appearance: alert and cooperative GI: normal findings: soft appropriately tender  nondistended + bs  Extremities: no edema, redness or tenderness in the calves or thighs  Disposition: Home or Self Care  Discharge Orders    Future Orders Please Complete By Expires   Diet - low sodium heart healthy      Discharge instructions      Discharge wound care:      Increase activity slowly      Discharge wound care:      Comments:   Keep incision clean and dry   Call MD for:  temperature >100.4      Call MD for:  severe uncontrolled pain      Call MD for:  persistant nausea and vomiting      Call MD for:  redness, tenderness, or signs of infection (pain, swelling, redness, odor or green/yellow discharge around incision site)        Current Discharge Medication List    START taking these medications   Details  ibuprofen (ADVIL,MOTRIN) 600 MG tablet Take 1 tablet (600 mg total) by mouth every 6 (six) hours as needed for pain (mild pain). Qty: 30 tablet, Refills: 1    oxyCODONE-acetaminophen (PERCOCET) 5-325 MG per tablet Take 1-2 tablets by mouth every 6 (six) hours as needed (moderate to severe pain (when tolerating  fluids)). Qty: 30 tablet, Refills: 0      CONTINUE these medications which have NOT CHANGED   Details  ciprofloxacin (CIPRO) 500 MG tablet Take 1 tablet (500 mg total) by mouth 2 (two) times daily. Qty: 6 tablet, Refills: 0    Cyanocobalamin (B-12) 500 MCG SUBL Place 500 mcg under the tongue.      cyclobenzaprine (FLEXERIL) 10 MG tablet Take 10 mg by mouth 2 (two) times daily as needed. For muscle spasms     Ferrous Sulfate (IRON SUPPLEMENT PO) Take 1 tablet by mouth daily.      hydrochlorothiazide 25 MG tablet Take 25 mg by mouth daily.      metroNIDAZOLE (FLAGYL) 500 MG tablet Take 1 tablet (500 mg total) by mouth 2 (two) times daily. Qty: 14 tablet, Refills: 0      STOP taking these medications     acetaminophen (TYLENOL) 500 MG tablet      predniSONE (DELTASONE) 50 MG tablet      norgestimate-ethinyl estradiol (ORTHO-CYCLEN) 0.25-35 MG-MCG per tablet        Follow-up Information    Follow up with Youlanda Tomassetti J.. Call on 03/04/2011. [phone].. Call for appt time for staple removal and  foley catherter removal )    Contact information:   [address] [place] [address] [phone]          Signed: Jessee Avers. 02/27/2011, 7:14 AM

## 2011-02-27 NOTE — Progress Notes (Signed)
Pt reports flatus.. Pain controlled no nausea or vomiting. Tolerating po  AF bp slightly elevated. See vitals Abdomen; soft appropriately tender nondistended + bs Incision CDI Ext no edema A/p POD #2 supracervical hysterectomy/ cystotomy repair/ cystoscopy  D/c home with foley .Marland Kitchen  Pt to continue cipro  Plan for foley removal in 1 wk

## 2011-02-27 NOTE — Plan of Care (Signed)
Problem: Phase III Progression Outcomes Goal: Remove staples if indicated/incision care Outcome: Not Applicable Date Met:  02/27/11 Patient to have staples removed at F/U visit

## 2011-03-04 NOTE — ED Provider Notes (Signed)
Medical screening examination/treatment/procedure(s) were performed by non-physician practitioner and as supervising physician I was immediately available for consultation/collaboration.  Riley Lam [facility] 03/04/11 0719

## 2011-03-13 ENCOUNTER — Encounter (HOSPITAL_COMMUNITY): Payer: Self-pay | Admitting: Obstetrics and Gynecology

## 2011-05-02 ENCOUNTER — Encounter (INDEPENDENT_AMBULATORY_CARE_PROVIDER_SITE_OTHER): Payer: BC Managed Care – PPO

## 2011-05-05 ENCOUNTER — Encounter: Payer: Self-pay | Admitting: *Deleted

## 2011-05-05 ENCOUNTER — Encounter: Payer: BC Managed Care – PPO | Attending: General Surgery | Admitting: *Deleted

## 2011-05-05 ENCOUNTER — Other Ambulatory Visit (INDEPENDENT_AMBULATORY_CARE_PROVIDER_SITE_OTHER): Payer: Self-pay

## 2011-05-05 DIAGNOSIS — Z09 Encounter for follow-up examination after completed treatment for conditions other than malignant neoplasm: Secondary | ICD-10-CM | POA: Insufficient documentation

## 2011-05-05 DIAGNOSIS — Z9884 Bariatric surgery status: Secondary | ICD-10-CM | POA: Insufficient documentation

## 2011-05-05 DIAGNOSIS — Z713 Dietary counseling and surveillance: Secondary | ICD-10-CM | POA: Insufficient documentation

## 2011-05-05 NOTE — Progress Notes (Signed)
  Follow-up visit: 5 Years Post-Operative LAGB Surgery  Medical Nutrition Therapy:  Appt start time: 1000 end time:  1100.  Assessment:  Primary concerns today: post-operative bariatric surgery nutrition management. Pt had a LAGB placed by Dr. Johna Sheriff on 05/26/2006. Her last weight was 204.9 (3 months post-op) on 08/25/06. Pt reports that during the past year she has regained a lot of weight due to eating too late, making poor food choices (pizza, chocolate, wine), and limited activity. Pt is here today to get back on track with eating. She recently met her insurance deducible so she is utilizing the opportunity to see the nutritionis/MDt. She wants to inquire on a special diet plan today. She states that she does not want a band fill because she has had some negative experiences with vomiting and food getting stuck yet at the same time she does not want to be able to "eat everything in sight". Pt reports that "I do not chew my food well". Pt is to see PA, Lenard Forth, on Friday yet is unsure if she will want a fill.   Weight today: 219.3 lbs Weight change: 14.4 lbs gain since last visit  Total weight lost: 26.8lbs BMI: 37.7 Weight goal: 170 lbs Lowest weight: 170 lbs (2008)  24-hr recall:  B (8:30-9 AM): 1 hard boiled egg OR scrambled egg Snk (10:30  AM): [place] yogurt w/ fruit, 2 T. granola  L (12-1 PM): 3/4 Salad from [employer] (200 calories), Light dressing OR Lean [place]ine Snk (3-4 PM): Cheese stick OR Yogurt cup OR Almonds  D (6-9 PM): Stir fry (chicken, vegetables, light sauce) OR Chili Snk (PM): N/A  Fluid intake: water w/ lemon, diet soda, Sweet tea, milk,  occasional wine intake Estimated total protein intake: 40-50g   Medications: On HTN medications Supplementation: Taking B12 and Iron  Using straws: No Drinking while eating: No Hair loss: None reported Carbonated beverages: Yes, Diet Soda (2-3 times/week) N/V/D/C: No Last Lap-Band fill: Pt has not had a band fill in over  a year  Recent physical activity:  Pt is 6 weeks s/p Hysterectomy. She is 3 weeks into her regular exercise patterns. She plays lifts weights, works out in gym, and plays Tennis, 3 times/week for 60 minutes+  Samples given: [employer] (20g/serving) Vanilla: [id number] / Exp 1/14 Chocolate: [id number] / Exp 1/14 Unflavored: [id number] / Exp 8/13  Progress Towards Goal(s):  In progress.  Handouts given during visit include:  Pre-Op Diet  Carbohydrate Counting Handout   Nutritional Diagnosis:  [place]-3.4 Unintentional weight gain As related to large portions of chocolate, wine, and carbohydrate rich foods.  As evidenced by pt with a a 50 lbs weight gain over the past 3 years s/p LAGB.    Intervention:  Nutrition education.  Monitoring/Evaluation:  Dietary intake, exercise, lap band fills, and body weight. Follow up in 1 months for 5 year post-op visit.

## 2011-05-05 NOTE — Patient Instructions (Addendum)
Goals:  Restart Pre-Op Diet Plan  Eat 3-6 small meals/snacks, every 3-5 hrs  Increase lean protein foods to meet 60-80g goal  Increase fluid intake to 64oz +  Consume < 15 grams of carbohydrate (fruit, whole grain, starchy vegetable) with meals as needed  Avoid drinking 15 minutes before, during and 30 minutes after eating  Continue to aim for >30 min of physical activity daily

## 2011-05-09 ENCOUNTER — Encounter (INDEPENDENT_AMBULATORY_CARE_PROVIDER_SITE_OTHER): Payer: BC Managed Care – PPO

## 2011-06-04 ENCOUNTER — Ambulatory Visit: Payer: BC Managed Care – PPO | Admitting: *Deleted

## 2011-06-17 ENCOUNTER — Ambulatory Visit: Payer: BC Managed Care – PPO | Admitting: *Deleted

## 2011-07-11 ENCOUNTER — Encounter (INDEPENDENT_AMBULATORY_CARE_PROVIDER_SITE_OTHER): Payer: BC Managed Care – PPO

## 2011-07-25 ENCOUNTER — Encounter (INDEPENDENT_AMBULATORY_CARE_PROVIDER_SITE_OTHER): Payer: BC Managed Care – PPO

## 2012-11-08 ENCOUNTER — Other Ambulatory Visit: Payer: Self-pay

## 2012-11-08 DIAGNOSIS — Z1231 Encounter for screening mammogram for malignant neoplasm of breast: Secondary | ICD-10-CM

## 2012-12-06 ENCOUNTER — Ambulatory Visit
Admission: RE | Admit: 2012-12-06 | Discharge: 2012-12-06 | Disposition: A | Discharge status: Home / Self Care | Payer: 59 | Source: Ambulatory Visit

## 2012-12-06 DIAGNOSIS — Z1231 Encounter for screening mammogram for malignant neoplasm of breast: Secondary | ICD-10-CM

## 2012-12-22 ENCOUNTER — Other Ambulatory Visit: Payer: Self-pay | Admitting: Family Medicine

## 2012-12-22 DIAGNOSIS — M545 Low back pain: Secondary | ICD-10-CM

## 2012-12-25 ENCOUNTER — Other Ambulatory Visit: Payer: 59

## 2012-12-26 ENCOUNTER — Ambulatory Visit
Admission: RE | Admit: 2012-12-26 | Discharge: 2012-12-26 | Disposition: A | Discharge status: Home / Self Care | Payer: 59 | Source: Ambulatory Visit | Attending: Family Medicine | Admitting: Family Medicine

## 2012-12-26 DIAGNOSIS — M545 Low back pain, unspecified: Secondary | ICD-10-CM

## 2014-01-17 ENCOUNTER — Other Ambulatory Visit: Payer: Self-pay

## 2014-01-17 DIAGNOSIS — Z1231 Encounter for screening mammogram for malignant neoplasm of breast: Secondary | ICD-10-CM

## 2014-01-24 ENCOUNTER — Encounter (INDEPENDENT_AMBULATORY_CARE_PROVIDER_SITE_OTHER): Payer: Self-pay

## 2014-01-24 ENCOUNTER — Ambulatory Visit
Admission: RE | Admit: 2014-01-24 | Discharge: 2014-01-24 | Disposition: A | Discharge status: Home / Self Care | Payer: 59 | Source: Ambulatory Visit

## 2014-01-24 DIAGNOSIS — Z1231 Encounter for screening mammogram for malignant neoplasm of breast: Secondary | ICD-10-CM

## 2014-05-25 ENCOUNTER — Encounter: Payer: 59 | Attending: Family Medicine | Admitting: Dietician

## 2014-05-25 VITALS — Ht 64.0 in | Wt 237.6 lb

## 2014-05-25 DIAGNOSIS — Z713 Dietary counseling and surveillance: Secondary | ICD-10-CM | POA: Diagnosis not present

## 2014-05-25 DIAGNOSIS — Z6841 Body Mass Index (BMI) 40.0 and over, adult: Secondary | ICD-10-CM | POA: Diagnosis not present

## 2014-05-25 DIAGNOSIS — E669 Obesity, unspecified: Secondary | ICD-10-CM | POA: Diagnosis present

## 2014-05-25 NOTE — Patient Instructions (Addendum)
Goal weight: 180 lbs  -Healthy weight loss is 1-2 pounds per week  Revamp shakes!  -Use less fruit - limit it to 1/2 a cup (use apples and berries)  -Spinach  -Low carb protein powder  -Unsweetened milk  -Unsweetened cocoa powder  -PB2  -Protein is the priority  -Consider seeing Dr. Excell Seltzer to get a band fill  -Remove trigger foods from the home  -Take time to prepare healthy foods  -Avoid drinking while eating  -Wait 15 minutes before and 30 minutes after eating  -Have something with protein to eat every 3-5 hours -Stash healthy snacks at work  -Increase physical activity when possible  -Walking, water aerobics, tennis, [employer] fitness classes

## 2014-05-25 NOTE — Progress Notes (Signed)
  Medical Nutrition Therapy:  Appt start time: 3212 end time:  1500.   Assessment:  Primary concerns today: Michaela Kennedy is here today to discuss her weight. She just had a knee replacement and hasn't done any exercise. She reports she has gained 30 pounds since April. She underwent LAGB in 2007 and has regained most of the weight she lost after surgery. She has not gotten a Lap Band fill in years and is not interested in a revision. Has some restriction and cannot eat much at one time. She reports that her goal weight is 180 lbs. She lives with her 21 year old son and ex husband.  Preferred Learning Style:   No preference indicated   Learning Readiness:   Contemplating  Ready   MEDICATIONS: see list   DIETARY INTAKE:  24-hr recall:  B ( AM): Boiled eggs or 1 packet of grits or bison meatloaf or shake with spinach and fruit  Snk ( AM):   L ( PM): salad or tuna/egg salad Snk ( PM): celery and carrots with hummus OR chips and salsa D ( PM): chili or sometimes pizza Snk ( PM): chips and salsa with cheese  Beverages: coffee, wine almost every night, water, occasionally soda  Usual physical activity: physical therapy 1x a week  Estimated energy needs: 1000-1200 calories   Progress Towards Goal(s):  In progress.   Nutritional Diagnosis:  Ducor-3.3 Overweight/obesity related to past poor dietary habits and physical inactivity as evidenced by patient w/ history of LAGB surgery following dietary guidelines for continued weight loss.     Intervention:  Nutrition counseling provided.  Goals:  Goal weight: 180 lbs  -Healthy weight loss is 1-2 pounds per week  Revamp shakes!  -Use less fruit - limit it to 1/2 a cup (use apples and berries)  -Spinach  -Low carb protein powder  -Unsweetened milk  -Unsweetened cocoa powder  -PB2  -Protein is the priority -Consider seeing Dr. Excell Seltzer to get a band fill -Remove trigger foods from the home -Take time to prepare healthy foods -Avoid  drinking while eating  -Wait 15 minutes before and 30 minutes after eating -Have something with protein to eat every 3-5 hours -Stash healthy snacks at work -Increase physical activity when possible  -Walking, water aerobics, tennis, [employer] fitness classes  Teaching Method Utilized:  Ship broker  Handouts given during visit include:  Phase 3A lean protein foods  Protein shakes  High fiber food list  Barriers to learning/adherence to lifestyle change: insufficient restriction with Lap Band  Demonstrated degree of understanding via:  Teach Back   Monitoring/Evaluation:  Dietary intake, exercise, lap band fills, and body weight in 2 month(s).

## 2014-05-26 ENCOUNTER — Encounter: Payer: Self-pay | Admitting: Dietician

## 2014-07-18 ENCOUNTER — Ambulatory Visit: Payer: 59 | Admitting: Dietician

## 2014-08-07 ENCOUNTER — Institutional Professional Consult (permissible substitution): Payer: 59 | Admitting: Pulmonary Disease

## 2015-02-01 ENCOUNTER — Emergency Department (HOSPITAL_BASED_OUTPATIENT_CLINIC_OR_DEPARTMENT_OTHER)
Admission: EM | Admit: 2015-02-01 | Discharge: 2015-02-01 | Disposition: A | Discharge status: Home / Self Care | Payer: 59 | Attending: Emergency Medicine | Admitting: Emergency Medicine

## 2015-02-01 ENCOUNTER — Encounter (HOSPITAL_BASED_OUTPATIENT_CLINIC_OR_DEPARTMENT_OTHER): Payer: Self-pay | Admitting: *Deleted

## 2015-02-01 DIAGNOSIS — I10 Essential (primary) hypertension: Secondary | ICD-10-CM | POA: Diagnosis present

## 2015-02-01 DIAGNOSIS — E669 Obesity, unspecified: Secondary | ICD-10-CM | POA: Diagnosis not present

## 2015-02-01 DIAGNOSIS — Z79899 Other long term (current) drug therapy: Secondary | ICD-10-CM | POA: Diagnosis not present

## 2015-02-01 DIAGNOSIS — R0602 Shortness of breath: Secondary | ICD-10-CM | POA: Insufficient documentation

## 2015-02-01 NOTE — ED Notes (Addendum)
Pt up at desk stating she is having anxiety regarding blood draw and would like to follow up with pcp.  MD notified and will be in to speak with pt momentarily.

## 2015-02-01 NOTE — ED Notes (Signed)
Attempted to draw pt's blood. She became very anxious and asked RN to stop. Asked if we could try again in a few minutes. Will try again at 2:15.

## 2015-02-01 NOTE — ED Notes (Signed)
Pt c/o increased BP x 2 days also states SOB x 1 hr

## 2015-02-01 NOTE — ED Provider Notes (Signed)
CSN: [id number]     Arrival date & time 02/01/15  1314 History   First MD Initiated Contact with Patient 02/01/15 1334     Chief Complaint  Patient presents with  . Hypertension     (Consider location/radiation/quality/duration/timing/severity/associated sxs/prior Treatment) HPI Comments: Patient is a 54 year old female who presents with complaints of elevated blood pressure for the past 2 days. She states she has felt somewhat weak. She has been checking her blood pressures and they have been increasing with each measurement. Today at work she became concerned about this and became short of breath. She denies any chest pain. Shortly after triage, she began to feel much improved. She has no complaints headache, nausea, fever, or other complaints.  Patient is a 54 y.o. female presenting with hypertension. The history is provided by the patient.  Hypertension This is a new problem. The current episode started 2 days ago. The problem occurs constantly. The problem has been gradually worsening. Associated symptoms include shortness of breath. Pertinent negatives include no chest pain. Nothing aggravates the symptoms. Nothing relieves the symptoms. She has tried nothing for the symptoms. The treatment provided no relief.    Past Medical History  Diagnosis Date  . Hypertension   . Obesity    Past Surgical History  Procedure Laterality Date  . Laparoscopic gastric banding    . Abdominal hysterectomy  02/25/2011    Procedure: HYSTERECTOMY ABDOMINAL;  Surgeon: Catha Brow;  Location: [facility];  Service: Gynecology;  Laterality: N/A;  Supercervical Hysterectomy with Cystoscopy  . Medial partial knee replacement     Family History  Problem Relation Age of Onset  . Sleep apnea Other   . Obesity Other   . Hypertension Other    History  Substance Use Topics  . Smoking status: Never Smoker   . Smokeless tobacco: Not on file  . Alcohol Use: Yes     Comment: occ   OB History    No data  available     Review of Systems  Respiratory: Positive for shortness of breath.   Cardiovascular: Negative for chest pain.  All other systems reviewed and are negative.     Allergies  Lisinopril  Home Medications   Prior to Admission medications   Medication Sig Start Date End Date Taking? Authorizing Provider  amLODipine (NORVASC) 5 MG tablet Take 5 mg by mouth daily.      Historical Provider, MD  bisoprolol-hydrochlorothiazide [facility]) 10-6.25 MG per tablet Take 1 tablet by mouth daily.    Historical Provider, MD  Cyanocobalamin (B-12) 500 MCG SUBL Place 500 mcg under the tongue.      Historical Provider, MD  cyclobenzaprine (FLEXERIL) 10 MG tablet Take 10 mg by mouth 2 (two) times daily as needed. For muscle spasms     Historical Provider, MD  Ferrous Sulfate (IRON SUPPLEMENT PO) Take 1 tablet by mouth daily.      Historical Provider, MD  hydrochlorothiazide 25 MG tablet Take 25 mg by mouth daily.      Historical Provider, MD   BP 211/98 mmHg  Pulse 20  Temp(Src) 98.2 F (36.8 C)  Resp 16  Ht 5\' 4"  (1.626 m)  Wt 225 lb (102.059 kg)  BMI 38.60 kg/m2  SpO2 98%  LMP 01/19/2011 Physical Exam  Constitutional: She is oriented to person, place, and time. She appears well-developed and well-nourished. No distress.  HENT:  Head: Normocephalic and atraumatic.  Mouth/Throat: Oropharynx is clear and moist.  Eyes: EOM are normal. Pupils  are equal, round, and reactive to light.  Neck: Normal range of motion. Neck supple.  Cardiovascular: Normal rate and regular rhythm.  Exam reveals no gallop and no friction rub.   No murmur heard. Pulmonary/Chest: Effort normal and breath sounds normal. No respiratory distress. She has no wheezes.  Abdominal: Soft. Bowel sounds are normal. She exhibits no distension. There is no tenderness.  Musculoskeletal: Normal range of motion.  Neurological: She is alert and oriented to person, place, and time. No cranial nerve deficit. She exhibits normal  muscle tone. Coordination normal.  Skin: Skin is warm and dry. She is not diaphoretic.  Nursing note and vitals reviewed.   ED Course  Procedures (including critical care time) Labs Review Labs Reviewed  BASIC METABOLIC PANEL  CBC WITH DIFFERENTIAL/PLATELET  TROPONIN I    Imaging Review No results found.   EKG Interpretation   Date/Time:  Thursday February 01 2015 13:51:04 EDT Ventricular Rate:  53 PR Interval:  190 QRS Duration: 102 QT Interval:  504 QTC Calculation: 472 R Axis:   29 Text Interpretation:  Sinus bradycardia Otherwise normal ECG Confirmed by  Charliegh Vasudevan  MD, Cathe Bilger ([id number]) on 02/01/2015 1:52:59 PM      MDM   Final diagnoses:  None    Patient presents here with complaints of elevated blood pressure. She reports weakness and fatigue but denies any chest pain or shortness of breath. My plan was to perform a workup with blood work and EKG. Her EKG was performed and is unremarkable. While attempting to draw blood the patient became very anxious and requested that this be stopped. She is now requesting to be discharged and wants to follow-up with her primary doctor. Her blood pressure at one point was 140s over 70s. After the blood draw and is again 188/105 and I suspect this is related to anxiety. She will be discharged to home. I will advise her to increase her Norvasc to 10 mg daily and keep a record of her blood pressures. She has an appointment with her doctor on Wednesday and can discuss her blood pressures with them at this time.    Veryl Speak, MD 02/01/15 [phone]

## 2015-02-01 NOTE — ED Notes (Signed)
Pt eloped. UTO ama signature.

## 2015-02-01 NOTE — Discharge Instructions (Signed)
Increase your Norvasc to 10 mg daily.  Keep a record of your blood pressures and take this with you to your next doctor's appointment.   Hypertension Hypertension, commonly called high blood pressure, is when the force of blood pumping through your arteries is too strong. Your arteries are the blood vessels that carry blood from your heart throughout your body. A blood pressure reading consists of a higher number over a lower number, such as 110/72. The higher number (systolic) is the pressure inside your arteries when your heart pumps. The lower number (diastolic) is the pressure inside your arteries when your heart relaxes. Ideally you want your blood pressure below 120/80. Hypertension forces your heart to work harder to pump blood. Your arteries may become narrow or stiff. Having hypertension puts you at risk for heart disease, stroke, and other problems.  RISK FACTORS Some risk factors for high blood pressure are controllable. Others are not.  Risk factors you cannot control include:   Race. You may be at higher risk if you are African American.  Age. Risk increases with age.  Gender. Men are at higher risk than women before age 23 years. After age 24, women are at higher risk than men. Risk factors you can control include:  Not getting enough exercise or physical activity.  Being overweight.  Getting too much fat, sugar, calories, or salt in your diet.  Drinking too much alcohol. SIGNS AND SYMPTOMS Hypertension does not usually cause signs or symptoms. Extremely high blood pressure (hypertensive crisis) may cause headache, anxiety, shortness of breath, and nosebleed. DIAGNOSIS  To check if you have hypertension, your health care provider will measure your blood pressure while you are seated, with your arm held at the level of your heart. It should be measured at least twice using the same arm. Certain conditions can cause a difference in blood pressure between your right and left  arms. A blood pressure reading that is higher than normal on one occasion does not mean that you need treatment. If one blood pressure reading is high, ask your health care provider about having it checked again. TREATMENT  Treating high blood pressure includes making lifestyle changes and possibly taking medicine. Living a healthy lifestyle can help lower high blood pressure. You may need to change some of your habits. Lifestyle changes may include:  Following the DASH diet. This diet is high in fruits, vegetables, and whole grains. It is low in salt, red meat, and added sugars.  Getting at least 2 hours of brisk physical activity every week.  Losing weight if necessary.  Not smoking.  Limiting alcoholic beverages.  Learning ways to reduce stress. If lifestyle changes are not enough to get your blood pressure under control, your health care provider may prescribe medicine. You may need to take more than one. Work closely with your health care provider to understand the risks and benefits. HOME CARE INSTRUCTIONS  Have your blood pressure rechecked as directed by your health care provider.   Take medicines only as directed by your health care provider. Follow the directions carefully. Blood pressure medicines must be taken as prescribed. The medicine does not work as well when you skip doses. Skipping doses also puts you at risk for problems.   Do not smoke.   Monitor your blood pressure at home as directed by your health care provider. SEEK MEDICAL CARE IF:   You think you are having a reaction to medicines taken.  You have recurrent headaches or feel dizzy.  You have swelling in your ankles.  You have trouble with your vision. SEEK IMMEDIATE MEDICAL CARE IF:  You develop a severe headache or confusion.  You have unusual weakness, numbness, or feel faint.  You have severe chest or abdominal pain.  You vomit repeatedly.  You have trouble breathing. MAKE SURE YOU:     Understand these instructions.  Will watch your condition.  Will get help right away if you are not doing well or get worse. Document Released: 07/28/2005 Document Revised: 12/12/2013 Document Reviewed: 05/20/2013 [facility] Patient Information 2015 [place], [place]. This information is not intended to replace advice given to you by your health care provider. Make sure you discuss any questions you have with your health care provider.

## 2015-02-01 NOTE — ED Notes (Signed)
MD at bedside. 

## 2015-02-27 ENCOUNTER — Other Ambulatory Visit: Payer: Self-pay

## 2015-02-27 DIAGNOSIS — Z1231 Encounter for screening mammogram for malignant neoplasm of breast: Secondary | ICD-10-CM

## 2015-03-06 ENCOUNTER — Ambulatory Visit
Admission: RE | Admit: 2015-03-06 | Discharge: 2015-03-06 | Disposition: A | Discharge status: Home / Self Care | Payer: 59 | Source: Ambulatory Visit

## 2015-03-06 DIAGNOSIS — Z1231 Encounter for screening mammogram for malignant neoplasm of breast: Secondary | ICD-10-CM

## 2015-12-05 ENCOUNTER — Encounter: Payer: Self-pay | Admitting: Pulmonary Disease

## 2015-12-05 ENCOUNTER — Ambulatory Visit (INDEPENDENT_AMBULATORY_CARE_PROVIDER_SITE_OTHER): Payer: BLUE CROSS/BLUE SHIELD | Admitting: Pulmonary Disease

## 2015-12-05 VITALS — BP 128/82 | HR 54 | Ht 64.0 in | Wt 247.0 lb

## 2015-12-05 DIAGNOSIS — R0609 Other forms of dyspnea: Secondary | ICD-10-CM

## 2015-12-05 DIAGNOSIS — G4710 Hypersomnia, unspecified: Secondary | ICD-10-CM | POA: Diagnosis not present

## 2015-12-05 NOTE — Assessment & Plan Note (Signed)
Weight reduction 

## 2015-12-05 NOTE — Assessment & Plan Note (Signed)
Likely 2/2 obesity, RVD. Possible OHS. May need ABG on f/u.

## 2015-12-05 NOTE — Patient Instructions (Signed)
1. We will order you a home sleep study.  2. We will call you with results of home sleep study.  3. We will order you auto CPAP if that is positive for sleep apnea. 4. Give [place] a call if her having issues with his CPAP machine.  Return to clinic in 2-3 mos

## 2015-12-05 NOTE — Assessment & Plan Note (Signed)
As you very well know, patient  Has worsening hypersomnia. She has snoring, witnessed apneas, gasping, unrefreshed sleep. She has work 8-5 and she sometimes gets sleepy during work. Hypersomnia affects her functionality. Her ex-husband has a CPAP machine and he has seen her stop breathing in her sleep. Has also gained weight the last several months.  Likely has moderate to severe sleep apnea.  Plan : 1. HST. She'll have new insurance starting May 1. She currently has [place], will switch to [facility]. 2. Anticipate no issues with CPAP. Her ex-husband uses CPAP machine. 3. Try autocpap. Needs ggod DL. If not corrected, may need BiPAP. 4. Sleep hygiene.

## 2015-12-05 NOTE — Progress Notes (Signed)
Subjective:    Patient ID: Clifton James, female    DOB: April 12, 1961, 55 y.o.   MRN: [id number]  HPI   This is the case of ANVI DAIGLER, 55 y.o. Female, who was referred by Dr. Bing Matter in consultation regarding possible OSA.   As you very well know, patient  Has worsening hypersomnia. She has snoring, witnessed apneas, gasping, unrefreshed sleep. She has work 8-5 and she sometimes gets sleepy during work. Hypersomnia affects her functionality. Her ex-husband has a CPAP machine and he has seen her stop breathing in her sleep. Has also gained weight the last several months.     Review of Systems  Constitutional: Negative.  Negative for fever and unexpected weight change.       Gained 50 lbs x 1 yr  HENT: Negative.  Negative for congestion, dental problem, ear pain, nosebleeds, postnasal drip, rhinorrhea, sinus pressure, sneezing, sore throat and trouble swallowing.   Eyes: Positive for itching. Negative for redness.  Respiratory: Positive for shortness of breath. Negative for cough, chest tightness and wheezing.   Cardiovascular: Negative.  Negative for palpitations and leg swelling.  Gastrointestinal: Negative.  Negative for nausea and vomiting.  Endocrine: Negative.   Genitourinary: Negative.  Negative for dysuria.  Musculoskeletal: Positive for arthralgias. Negative for joint swelling.  Skin: Negative.  Negative for rash.  Allergic/Immunologic: Positive for environmental allergies.  Neurological: Negative.  Negative for headaches.  Hematological: Negative.  Does not bruise/bleed easily.  Psychiatric/Behavioral: Negative.  Negative for dysphoric mood. The patient is not nervous/anxious.   All other systems reviewed and are negative.  Past Medical History  Diagnosis Date  . Hypertension   . Obesity   (-)CA, DVT   Family History  Problem Relation Age of Onset  . Sleep apnea Other   . Obesity Other   . Hypertension Other      Past Surgical History  Procedure  Laterality Date  . Laparoscopic gastric banding    . Abdominal hysterectomy  02/25/2011    Procedure: HYSTERECTOMY ABDOMINAL;  Surgeon: Catha Brow;  Location: [facility];  Service: Gynecology;  Laterality: N/A;  Supercervical Hysterectomy with Cystoscopy  . Medial partial knee replacement      Social History   Social History  . Marital Status: Single    Spouse Name: N/A  . Number of Children: N/A  . Years of Education: N/A   Occupational History  . Not on file.   Social History Main Topics  . Smoking status: Never Smoker   . Smokeless tobacco: Not on file  . Alcohol Use: Yes     Comment: occ  . Drug Use: No  . Sexual Activity: No   Other Topics Concern  . Not on file   Social History Narrative   (-) smoking, occasional wine. Does office work.   Allergies  Allergen Reactions  . Lisinopril Anaphylaxis    Took it for years with no issues, but a few weeks ago experienced anaphylaxis from lisinopril--tongue swelled up and patient went to ED  . Hydrocodone-Acetaminophen Itching     Outpatient Prescriptions Prior to Visit  Medication Sig Dispense Refill  . amLODipine (NORVASC) 5 MG tablet Take 5 mg by mouth daily.      . bisoprolol-hydrochlorothiazide (ZIAC) 10-6.25 MG per tablet Take 1 tablet by mouth daily.    . cyclobenzaprine (FLEXERIL) 10 MG tablet Take 10 mg by mouth 2 (two) times daily as needed. For muscle spasms     . Ferrous Sulfate (IRON SUPPLEMENT  PO) Take 1 tablet by mouth daily.      . hydrochlorothiazide 25 MG tablet Take 25 mg by mouth daily.      . Cyanocobalamin (B-12) 500 MCG SUBL Place 500 mcg under the tongue. Reported on 12/05/2015     No facility-administered medications prior to visit.   No orders of the defined types were placed in this encounter.           Objective:   Physical Exam   Vitals:  Filed Vitals:   12/05/15 1011  BP: 128/82  Pulse: 54  Height: 5\' 4"  (1.626 m)  Weight: 247 lb (112.038 kg)  SpO2: 96%     Constitutional/General:  Pleasant, well-nourished, well-developed, not in any distress,  Comfortably seating.  Well kempt  Body mass index is 42.38 kg/(m^2). Wt Readings from Last 3 Encounters:  12/05/15 247 lb (112.038 kg)  02/01/15 225 lb (102.059 kg)  05/26/14 237 lb 9.6 oz (107.775 kg)    Neck circumference: 17 inches ESS 10  HEENT: Pupils equal and reactive to light and accommodation. Anicteric sclerae. Normal nasal mucosa.   No oral  lesions,  mouth clear,  oropharynx clear, no postnasal drip. (-) Oral thrush. No dental caries.  Airway - Mallampati class IV. Large tongue  Neck: No masses. Midline trachea. No JVD, (-) LAD. (-) bruits appreciated.  Respiratory/Chest: Grossly normal chest. (-) deformity. (-) Accessory muscle use.  Symmetric expansion. (-) Tenderness on palpation.  Resonant on percussion.  Diminished BS on both lower lung zones. (-) wheezing, crackles, rhonchi (-) egophony  Cardiovascular: Regular rate and  rhythm, heart sounds normal, no murmur or gallops, no peripheral edema  Gastrointestinal:  Normal bowel sounds. Soft, non-tender. No hepatosplenomegaly.  (-) masses.   Musculoskeletal:  Normal muscle tone. Normal gait.   Extremities: Grossly normal. (-) clubbing, cyanosis.  (-) edema  Skin: (-) rash,lesions seen.   Neurological/Psychiatric : alert, oriented to time, place, person. Normal mood and affect           Assessment & Plan:  Hypersomnia As you very well know, patient  Has worsening hypersomnia. She has snoring, witnessed apneas, gasping, unrefreshed sleep. She has work 8-5 and she sometimes gets sleepy during work. Hypersomnia affects her functionality. Her ex-husband has a CPAP machine and he has seen her stop breathing in her sleep. Has also gained weight the last several months.  Likely has moderate to severe sleep apnea.  Plan : 1. HST. She'll have new insurance starting May 1. She currently has [place], will switch to [employer]. 2. Anticipate no issues with CPAP. Her ex-husband uses CPAP machine. 3. Try autocpap. Needs ggod DL. If not corrected, may need BiPAP. 4. Sleep hygiene.   Morbid obesity ([place]) Weight reduction  Exertional dyspnea Likely 2/2 obesity, RVD. Possible OHS. May need ABG on f/u.      Thank you very much for letting me participate in this patient's care. Please do not hesitate to give me a call if you have any questions or concerns regarding the treatment plan.   Patient will follow up with me in 2-3 mos.    Monica Becton, MD 12/05/2015   10:40 AM [facility] Pager: [phone] Office: [phone], Fax: [phone]

## 2015-12-24 DIAGNOSIS — G4733 Obstructive sleep apnea (adult) (pediatric): Secondary | ICD-10-CM | POA: Diagnosis not present

## 2015-12-27 ENCOUNTER — Telehealth: Payer: Self-pay | Admitting: Pulmonary Disease

## 2015-12-27 DIAGNOSIS — G4733 Obstructive sleep apnea (adult) (pediatric): Secondary | ICD-10-CM

## 2015-12-27 NOTE — Telephone Encounter (Signed)
  Please call the pt and tell the pt the [place]  showed severe OSA   Pt stops breathing  58  times an hour.   Please order autoCPAP 5-15 cm H2O. Patient will need a mask fitting session. Patient will need a 1 month download.   Patient needs to be seen by me or any of the NPs/APPs  4-6 weeks after obtaining the cpap machine. Let me know if you receive this.   Thanks!   J. Shirl Harris, MD 12/27/2015, 4:21 PM

## 2015-12-28 ENCOUNTER — Other Ambulatory Visit: Payer: Self-pay | Admitting: *Deleted

## 2015-12-28 DIAGNOSIS — G4733 Obstructive sleep apnea (adult) (pediatric): Secondary | ICD-10-CM | POA: Diagnosis not present

## 2015-12-28 DIAGNOSIS — G4710 Hypersomnia, unspecified: Secondary | ICD-10-CM

## 2015-12-31 NOTE — Telephone Encounter (Signed)
Spoke with pt and gave results and recommendations. Pt agrees to start CPAP therapy. Order placed. Pt aware to call office and schedule f/u appt once starts CPAP. Nothing further needed.  

## 2016-02-18 ENCOUNTER — Ambulatory Visit: Payer: BLUE CROSS/BLUE SHIELD | Admitting: Pulmonary Disease

## 2016-11-18 ENCOUNTER — Telehealth: Payer: Self-pay | Admitting: Pulmonary Disease

## 2016-11-18 DIAGNOSIS — G4733 Obstructive sleep apnea (adult) (pediatric): Secondary | ICD-10-CM

## 2016-11-18 NOTE — Telephone Encounter (Signed)
Pt returning call [phone].Michaela Kennedy'

## 2016-11-18 NOTE — Telephone Encounter (Signed)
Patient returning call - she can be reached at [phone] -pr

## 2016-11-18 NOTE — Telephone Encounter (Signed)
lmomtcb x1 

## 2016-11-18 NOTE — Telephone Encounter (Signed)
AD  Please Advise-  This pt had a home sleep study 12/24/15, and the results were given to her. The pt was not able to set up her cpap at the time due to the cost. She now has new insurance and would like a new order placed. Please let us know if it is ok to place the order.

## 2016-11-19 DIAGNOSIS — G4733 Obstructive sleep apnea (adult) (pediatric): Secondary | ICD-10-CM | POA: Insufficient documentation

## 2016-11-19 NOTE — Telephone Encounter (Signed)
OK to order cpap based on her sleep study report in 2017. Thanks.  Monica Becton, MD 11/19/2016, 5:06 AM [facility] Pager [phone] After 3 pm or if no answer, call [phone]

## 2016-11-19 NOTE — Telephone Encounter (Signed)
Spoke with the pt and advised that we can go ahead and order CPAP  I advised that once she gets the machine to call for 30-90 day rov with AD or NP  She verbalized understanding of this and I have sent order to [facility]

## 2016-11-19 NOTE — Telephone Encounter (Signed)
Patient returning call - she can be reached at [phone] -pr

## 2016-11-19 NOTE — Telephone Encounter (Signed)
lmtcb x1 for pt. 

## 2016-11-20 ENCOUNTER — Other Ambulatory Visit: Payer: Self-pay | Admitting: Family Medicine

## 2016-11-20 DIAGNOSIS — Z1231 Encounter for screening mammogram for malignant neoplasm of breast: Secondary | ICD-10-CM

## 2016-11-21 ENCOUNTER — Other Ambulatory Visit (HOSPITAL_COMMUNITY): Payer: Self-pay | Admitting: General Surgery

## 2016-11-21 DIAGNOSIS — Z9884 Bariatric surgery status: Secondary | ICD-10-CM

## 2016-11-25 ENCOUNTER — Ambulatory Visit (HOSPITAL_COMMUNITY)
Admission: RE | Admit: 2016-11-25 | Discharge: 2016-11-25 | Disposition: A | Discharge status: Home / Self Care | Payer: No Typology Code available for payment source | Source: Ambulatory Visit | Attending: General Surgery | Admitting: General Surgery

## 2016-11-25 ENCOUNTER — Encounter (HOSPITAL_COMMUNITY): Payer: Self-pay

## 2016-12-08 ENCOUNTER — Other Ambulatory Visit: Payer: Self-pay | Admitting: General Surgery

## 2016-12-08 DIAGNOSIS — Z9884 Bariatric surgery status: Secondary | ICD-10-CM

## 2016-12-09 ENCOUNTER — Other Ambulatory Visit: Payer: No Typology Code available for payment source

## 2016-12-12 ENCOUNTER — Ambulatory Visit
Admission: RE | Admit: 2016-12-12 | Discharge: 2016-12-12 | Disposition: A | Discharge status: Home / Self Care | Payer: 59 | Source: Ambulatory Visit | Attending: Family Medicine | Admitting: Family Medicine

## 2016-12-12 ENCOUNTER — Ambulatory Visit: Payer: No Typology Code available for payment source

## 2016-12-12 ENCOUNTER — Ambulatory Visit
Admission: RE | Admit: 2016-12-12 | Discharge: 2016-12-12 | Disposition: A | Discharge status: Home / Self Care | Payer: 59 | Source: Ambulatory Visit | Attending: General Surgery | Admitting: General Surgery

## 2016-12-12 DIAGNOSIS — Z9884 Bariatric surgery status: Secondary | ICD-10-CM

## 2016-12-12 DIAGNOSIS — Z1231 Encounter for screening mammogram for malignant neoplasm of breast: Secondary | ICD-10-CM

## 2016-12-15 ENCOUNTER — Ambulatory Visit: Payer: No Typology Code available for payment source

## 2017-01-13 ENCOUNTER — Ambulatory Visit: Payer: No Typology Code available for payment source | Admitting: Skilled Nursing Facility1

## 2017-01-28 ENCOUNTER — Telehealth: Payer: Self-pay | Admitting: Internal Medicine

## 2017-01-28 NOTE — Telephone Encounter (Signed)
CPAP DL came to Dr Gustavus Bryant attn  This is an AD pt who will need f/u soon with NP or sleep doc  Looks like she is non compliant with her CPAP  LMTCB

## 2017-01-30 NOTE — Telephone Encounter (Signed)
LMTCB

## 2017-02-03 NOTE — Telephone Encounter (Signed)
LMTCB

## 2017-02-05 NOTE — Telephone Encounter (Signed)
lmtcb X3 for pt. 

## 2017-02-09 ENCOUNTER — Encounter: Payer: Self-pay | Admitting: *Deleted

## 2017-02-09 NOTE — Telephone Encounter (Signed)
Letter mailed to the pt to call for appt

## 2017-07-30 ENCOUNTER — Other Ambulatory Visit: Payer: Self-pay | Admitting: Gastroenterology

## 2017-08-26 ENCOUNTER — Other Ambulatory Visit: Payer: Self-pay

## 2017-08-26 ENCOUNTER — Encounter (HOSPITAL_COMMUNITY): Payer: Self-pay | Admitting: Emergency Medicine

## 2017-09-07 NOTE — Anesthesia Preprocedure Evaluation (Addendum)
Anesthesia Evaluation  Patient identified by MRN, date of birth, ID band Patient awake    Reviewed: Allergy & Precautions, NPO status , Patient's Chart, lab work & pertinent test results  Airway Mallampati: II  TM Distance: >3 FB Neck ROM: Full    Dental no notable dental hx. (+) Dental Advisory Given   Pulmonary neg pulmonary ROS,    Pulmonary exam normal breath sounds clear to auscultation       Cardiovascular hypertension, negative cardio ROS Normal cardiovascular exam Rhythm:Regular Rate:Normal     Neuro/Psych negative neurological ROS  negative psych ROS   GI/Hepatic negative GI ROS, Neg liver ROS,   Endo/Other  negative endocrine ROS  Renal/GU negative Renal ROS  negative genitourinary   Musculoskeletal negative musculoskeletal ROS (+)   Abdominal   Peds  Hematology negative hematology ROS (+)   Anesthesia Other Findings   Reproductive/Obstetrics                            Anesthesia Physical Anesthesia Plan  ASA: III  Anesthesia Plan: MAC   Post-op Pain Management:    Induction: Intravenous  PONV Risk Score and Plan: Treatment may vary due to age or medical condition  Airway Management Planned: Mask, Natural Airway and Nasal Cannula  Additional Equipment:   Intra-op Plan:   Post-operative Plan:   Informed Consent: I have reviewed the patients History and Physical, chart, labs and discussed the procedure including the risks, benefits and alternatives for the proposed anesthesia with the patient or authorized representative who has indicated his/her understanding and acceptance.     Plan Discussed with: CRNA and Anesthesiologist  Anesthesia Plan Comments:        Anesthesia Quick Evaluation

## 2017-09-08 ENCOUNTER — Ambulatory Visit (HOSPITAL_COMMUNITY): Payer: No Typology Code available for payment source | Admitting: Anesthesiology

## 2017-09-08 ENCOUNTER — Encounter (HOSPITAL_COMMUNITY)
Admission: RE | Disposition: A | Discharge status: Home / Self Care | Payer: Self-pay | Source: Ambulatory Visit | Attending: Gastroenterology

## 2017-09-08 ENCOUNTER — Encounter (HOSPITAL_COMMUNITY): Payer: Self-pay | Admitting: *Deleted

## 2017-09-08 ENCOUNTER — Ambulatory Visit (HOSPITAL_COMMUNITY)
Admission: RE | Admit: 2017-09-08 | Discharge: 2017-09-08 | Disposition: A | Discharge status: Home / Self Care | Payer: No Typology Code available for payment source | Source: Ambulatory Visit | Attending: Gastroenterology | Admitting: Gastroenterology

## 2017-09-08 ENCOUNTER — Other Ambulatory Visit: Payer: Self-pay

## 2017-09-08 DIAGNOSIS — Z1211 Encounter for screening for malignant neoplasm of colon: Secondary | ICD-10-CM | POA: Insufficient documentation

## 2017-09-08 DIAGNOSIS — Z885 Allergy status to narcotic agent status: Secondary | ICD-10-CM | POA: Insufficient documentation

## 2017-09-08 DIAGNOSIS — I10 Essential (primary) hypertension: Secondary | ICD-10-CM | POA: Insufficient documentation

## 2017-09-08 DIAGNOSIS — Z888 Allergy status to other drugs, medicaments and biological substances status: Secondary | ICD-10-CM | POA: Insufficient documentation

## 2017-09-08 DIAGNOSIS — K648 Other hemorrhoids: Secondary | ICD-10-CM | POA: Diagnosis not present

## 2017-09-08 DIAGNOSIS — Z6841 Body Mass Index (BMI) 40.0 and over, adult: Secondary | ICD-10-CM | POA: Diagnosis not present

## 2017-09-08 DIAGNOSIS — K219 Gastro-esophageal reflux disease without esophagitis: Secondary | ICD-10-CM | POA: Insufficient documentation

## 2017-09-08 DIAGNOSIS — R1310 Dysphagia, unspecified: Secondary | ICD-10-CM | POA: Diagnosis not present

## 2017-09-08 DIAGNOSIS — Z79899 Other long term (current) drug therapy: Secondary | ICD-10-CM | POA: Insufficient documentation

## 2017-09-08 DIAGNOSIS — Z7982 Long term (current) use of aspirin: Secondary | ICD-10-CM | POA: Insufficient documentation

## 2017-09-08 HISTORY — PX: ESOPHAGOGASTRODUODENOSCOPY (EGD) WITH PROPOFOL: SHX5813

## 2017-09-08 HISTORY — PX: COLONOSCOPY WITH PROPOFOL: SHX5780

## 2017-09-08 SURGERY — ESOPHAGOGASTRODUODENOSCOPY (EGD) WITH PROPOFOL
Anesthesia: Monitor Anesthesia Care

## 2017-09-08 MED ORDER — PROPOFOL 10 MG/ML IV BOLUS
INTRAVENOUS | Status: DC | PRN
  Administered 2017-09-08 (×9): 50 mg via INTRAVENOUS

## 2017-09-08 MED ORDER — PROPOFOL 10 MG/ML IV BOLUS
INTRAVENOUS | Status: AC
  Filled 2017-09-08: qty 40

## 2017-09-08 MED ORDER — SODIUM CHLORIDE 0.9 % IV SOLN: INTRAVENOUS | Status: DC

## 2017-09-08 MED ORDER — PROPOFOL 10 MG/ML IV BOLUS
INTRAVENOUS | Status: AC
  Filled 2017-09-08: qty 20

## 2017-09-08 MED ORDER — LACTATED RINGERS IV SOLN
INTRAVENOUS | Status: DC
  Administered 2017-09-08: 07:00:00 via INTRAVENOUS

## 2017-09-08 MED ORDER — LIDOCAINE 2% (20 MG/ML) 5 ML SYRINGE
INTRAMUSCULAR | Status: DC | PRN
  Administered 2017-09-08: 40 mg via INTRAVENOUS

## 2017-09-08 SURGICAL SUPPLY — 25 items

## 2017-09-08 NOTE — Discharge Instructions (Signed)
Esophagogastroduodenoscopy, Care After °Refer to this sheet in the next few weeks. These instructions provide you with information about caring for yourself after your procedure. Your health care provider may also give you more specific instructions. Your treatment has been planned according to current medical practices, but problems sometimes occur. Call your health care provider if you have any problems or questions after your procedure. °What can I expect after the procedure? °After the procedure, it is common to have: °· A sore throat. °· Nausea. °· Bloating. °· Dizziness. °· Fatigue. ° °Follow these instructions at home: °· Do not eat or drink anything until the numbing medicine (local anesthetic) has worn off and your gag reflex has returned. You will know that the local anesthetic has worn off when you can swallow comfortably. °· Do not drive for 24 hours if you received a medicine to help you relax (sedative). °· If your health care provider took a tissue sample for testing during the procedure, make sure to get your test results. This is your responsibility. Ask your health care provider or the department performing the test when your results will be ready. °· Keep all follow-up visits as told by your health care provider. This is important. °Contact a health care provider if: °· You cannot stop coughing. °· You are not urinating. °· You are urinating less than usual. °Get help right away if: °· You have trouble swallowing. °· You cannot eat or drink. °· You have throat or chest pain that gets worse. °· You are dizzy or light-headed. °· You faint. °· You have nausea or vomiting. °· You have chills. °· You have a fever. °· You have severe abdominal pain. °· You have black, tarry, or bloody stools. °This information is not intended to replace advice given to you by your health care provider. Make sure you discuss any questions you have with your health care provider. °Document Released: 07/14/2012 Document  Revised: 01/03/2016 Document Reviewed: 06/21/2015 °Elsevier Interactive Patient Education © 2018 [employer] °Colonoscopy, Adult, Care After °This sheet gives you information about how to care for yourself after your procedure. Your doctor may also give you more specific instructions. If you have problems or questions, call your doctor. °Follow these instructions at home: °General instructions ° °· For the first 24 hours after the procedure: °? Do not drive or use machinery. °? Do not sign important documents. °? Do not drink alcohol. °? Do your daily activities more slowly than normal. °? Eat foods that are soft and easy to digest. °? Rest often. °· Take over-the-counter or prescription medicines only as told by your doctor. °· It is up to you to get the results of your procedure. Ask your doctor, or the department performing the procedure, when your results will be ready. °To help cramping and bloating: °· Try walking around. °· Put heat on your belly (abdomen) as told by your doctor. Use a heat source that your doctor recommends, such as a moist heat pack or a heating pad. °? Put a towel between your skin and the heat source. °? Leave the heat on for 20-30 minutes. °? Remove the heat if your skin turns bright red. This is especially important if you cannot feel pain, heat, or cold. You can get burned. °Eating and drinking °· Drink enough fluid to keep your pee (urine) clear or pale yellow. °· Return to your normal diet as told by your doctor. Avoid heavy or fried foods that are hard to digest. °· Avoid   drinking alcohol for as long as told by your doctor. °Contact a doctor if: °· You have blood in your poop (stool) 2-3 days after the procedure. °Get help right away if: °· You have more than a small amount of blood in your poop. °· You see large clumps of tissue (blood clots) in your poop. °· Your belly is swollen. °· You feel sick to your stomach (nauseous). °· You throw up (vomit). °· You have a fever. °· You  have belly pain that gets worse, and medicine does not help your pain. °This information is not intended to replace advice given to you by your health care provider. Make sure you discuss any questions you have with your health care provider. °Document Released: 08/30/2010 Document Revised: 04/21/2016 Document Reviewed: 04/21/2016 °Elsevier Interactive Patient Education © 2017 [employer] ° °

## 2017-09-08 NOTE — Transfer of Care (Signed)
Immediate Anesthesia Transfer of Care Note  Patient: Michaela Kennedy  Procedure(s) Performed: ESOPHAGOGASTRODUODENOSCOPY (EGD) WITH PROPOFOL (N/A ) COLONOSCOPY WITH PROPOFOL (N/A )  Patient Location: PACU and Endoscopy Unit  Anesthesia Type:MAC  Level of Consciousness: awake and alert   Airway & Oxygen Therapy: Patient Spontanous Breathing and Patient connected to nasal cannula oxygen  Post-op Assessment: Report given to RN and Post -op Vital signs reviewed and stable  Post vital signs: Reviewed and stable  Last Vitals:  Vitals:   09/08/17 0641  BP: (!) 145/85  Pulse: 61  Resp: 10  Temp: 36.9 C  SpO2: 96%    Last Pain:  Vitals:   09/08/17 0641  TempSrc: Oral         Complications: No apparent anesthesia complications

## 2017-09-08 NOTE — Anesthesia Procedure Notes (Signed)
Procedure Name: MAC Date/Time: 09/08/2017 7:21 AM Performed by: Cynda Familia, CRNA Pre-anesthesia Checklist: Patient identified, Emergency Drugs available, Suction available, Patient being monitored and Timeout performed Patient Re-evaluated:Patient Re-evaluated prior to induction Oxygen Delivery Method: Nasal cannula Placement Confirmation: positive ETCO2 and breath sounds checked- equal and bilateral Dental Injury: Teeth and Oropharynx as per pre-operative assessment  Comments: [place] for sedation -- bite block by Collene Mares

## 2017-09-08 NOTE — Anesthesia Postprocedure Evaluation (Signed)
Anesthesia Post Note  Patient: Michaela Kennedy  Procedure(s) Performed: ESOPHAGOGASTRODUODENOSCOPY (EGD) WITH PROPOFOL (N/A ) COLONOSCOPY WITH PROPOFOL (N/A )     Patient location during evaluation: PACU Anesthesia Type: MAC Level of consciousness: awake and alert Pain management: pain level controlled Vital Signs Assessment: post-procedure vital signs reviewed and stable Respiratory status: spontaneous breathing, nonlabored ventilation, respiratory function stable and patient connected to nasal cannula oxygen Cardiovascular status: stable and blood pressure returned to baseline Postop Assessment: no apparent nausea or vomiting Anesthetic complications: no    Last Vitals:  Vitals:   09/08/17 0814 09/08/17 0820  BP: 119/78 (!) 111/95  Pulse: (!) 54 (!) 51  Resp: 14 14  Temp:    SpO2: 94% 96%    Last Pain:  Vitals:   09/08/17 0641  TempSrc: Oral                 Barnet Glasgow

## 2017-09-08 NOTE — H&P (Signed)
Michaela Kennedy is an 57 y.o. female.   Chief Complaint: Colorectal cancer screening. HPI: 57 year old white female here for a screening colonoscopy. She is also having an EGD as she has had worsening acid reflux. She has severe sleep apnea with an AHI score of 58.2. See office notes for details.   Past Medical History:  Diagnosis Date  . Hypertension   . Obesity    Past Surgical History:  Procedure Laterality Date  . ABDOMINAL HYSTERECTOMY  02/25/2011   Procedure: HYSTERECTOMY ABDOMINAL;  Surgeon: Catha Brow;  Location: [facility];  Service: Gynecology;  Laterality: N/A;  Supercervical Hysterectomy with Cystoscopy  . LAPAROSCOPIC GASTRIC BANDING    . MEDIAL PARTIAL KNEE REPLACEMENT     Family History  Problem Relation Age of Onset  . Sleep apnea Other   . Obesity Other   . Hypertension Other   . Breast cancer Neg Hx    Social History:  reports that  has never smoked. she has never used smokeless tobacco. She reports that she drinks alcohol. She reports that she does not use drugs.  Allergies:  Allergies  Allergen Reactions  . Lisinopril Anaphylaxis and Other (See Comments)    Took it for years with no issues, but a few weeks ago experienced anaphylaxis from lisinopril--tongue swelled up and patient went to ED  . Hydrocodone-Acetaminophen Itching   Medications Prior to Admission  Medication Sig Dispense Refill  . ALPRAZolam (XANAX) 0.25 MG tablet Take 0.25 mg by mouth daily as needed for anxiety.  0  . amLODipine (NORVASC) 5 MG tablet Take 5 mg by mouth daily.      Marland Kitchen aspirin EC 81 MG tablet Take 81 mg by mouth daily.    . bisoprolol-hydrochlorothiazide (ZIAC) 10-6.25 MG per tablet Take 1 tablet by mouth daily.    . cyclobenzaprine (FLEXERIL) 10 MG tablet Take 10 mg by mouth 2 (two) times daily as needed for muscle spasms.     Marland Kitchen dexlansoprazole (DEXILANT) 60 MG capsule Take 60 mg by mouth daily.    . hydrochlorothiazide 25 MG tablet Take 25 mg by mouth daily.      . naproxen  sodium (ALEVE) 220 MG tablet Take 440 mg by mouth daily as needed (for pain or headache).    . valACYclovir (VALTREX) 1000 MG tablet Take 2 g by mouth 2 (two) times daily as needed (for out breaks).   5   No results found for this or any previous visit (from the past 48 hour(s)). No results found.  Review of Systems  Constitutional: Negative for chills, diaphoresis, fever, malaise/fatigue and weight loss.  HENT: Negative.   Respiratory: Negative.   Gastrointestinal: Positive for heartburn. Negative for abdominal pain, blood in stool, constipation, diarrhea, melena, nausea and vomiting.  Genitourinary: Negative.   Musculoskeletal: Positive for joint pain.  Skin: Negative.   Neurological: Negative.  Negative for weakness.  Endo/Heme/Allergies: Negative.   Psychiatric/Behavioral: Negative.     Blood pressure (!) 145/85, pulse 61, temperature 98.4 F (36.9 C), temperature source Oral, resp. rate 10, height 5\' 4"  (1.626 m), weight 108.9 kg (240 lb), last menstrual period 01/19/2011, SpO2 96 %. Physical Exam  Constitutional: She is oriented to person, place, and time. She appears well-developed and well-nourished.  Morbidly obese  HENT:  Head: Normocephalic and atraumatic.  Eyes: Conjunctivae and EOM are normal. Pupils are equal, round, and reactive to light.  Neck: Normal range of motion. Neck supple.  Cardiovascular: Normal rate and regular rhythm.  Respiratory:  Effort normal and breath sounds normal.  GI: Soft. Bowel sounds are normal.  Neurological: She is alert and oriented to person, place, and time.  Skin: Skin is warm and dry.  Psychiatric: She has a normal mood and affect. Her behavior is normal. Judgment and thought content normal.    Assessment/Plan Colorectal cancer screening/acid reflux-proceed with an EGD/Colonoscopy at this time.  Cruze Zingaro, MD 09/08/2017, 7:10 AM

## 2017-09-08 NOTE — Op Note (Signed)
[facility] Patient Name: Michaela Kennedy Procedure Date: 09/08/2017 MRN: [id number] Attending MD: Juanita Craver , MD Date of Birth: 03-Apr-1961 CSN: [id number] Age: 57 Admit Type: Outpatient Procedure:                Screening colonoscopy Indications:              CRC screening for colorectal malignant neoplasm. Providers:                Juanita Craver, MD, Cleda Daub, RN, Alan Mulder,                            Technician, Glenis Smoker, CRNA Referring MD:             Aletha Halim PA-C Medicines:                Monitored Anesthesia Care Complications:            No immediate complications. Estimated Blood Loss:     Estimated blood loss: none. Procedure:                Pre-anesthesia assessment: - Prior to the                            procedure, a history and physical was performed,                            and patient medications and allergies were                            reviewed. The patient's tolerance of previous                            anesthesia was also reviewed. The risks and                            benefits of the procedure and the sedation options                            and risks were discussed with the patient. All                            questions were answered, and informed consent was                            obtained. Prior anticoagulants: The patient has                            taken no previous anticoagulant or antiplatelet                            agents. ASA Grade assessment: III - A patient with                            severe systemic disease. After reviewing the risks  and benefits, the patient was deemed in                            satisfactory condition to undergo the procedure.                            After obtaining informed consent, the colonoscope                            was passed under direct vision. Throughout the                            procedure, the patient's blood  pressure, pulse, and                            oxygen saturations were monitored continuously. The                            (EC [id number]) [id number] was introduced through the                            anus and advanced to the the cecum, identified by                            appendiceal orifice and ileocecal valve. The                            colonoscopy was performed without difficulty. The                            patient tolerated the procedure well. The quality                            of the bowel preparation was adequate. The                            ileocecal valve, the appendiceal orifice and the                            rectum were photographed. The bowel preparation                            used was GoLYTELY. Scope In: 7:41:29 AM Scope Out: 7:54:50 AM Scope Withdrawal Time: 0 hours 6 minutes 7 seconds  Total Procedure Duration: 0 hours 13 minutes 21 seconds  Findings:      The entire examined colon appeared normal.      Prominent internal hemorrhoids were noted on retroflexion. Impression:               - The entire examined colon is normal.                           - Prominent internal hemorrhoids.                           -  No specimens collected. Moderate Sedation:      MAC used. Recommendation:           - High fiber, low fat diet with augmented water                            consumption daily.                           - Continue present medications.                           - Repeat colonoscopy in 10 years for surveillance.                           - Return to GI office PRN.                           - If the patient has any abnormal GI symptoms in                            the interim, she has been advised to call the                            office ASAP for further recommendations. Procedure Code(s):        --- Professional ---                           [phone], Colonoscopy, flexible; diagnostic, including                            collection  of specimen(s) by brushing or washing,                            when performed (separate procedure) Diagnosis Code(s):        --- Professional ---                           Z12.11, Encounter for screening for malignant                            neoplasm of colon CPT copyright 2016 American [employer]. All rights reserved. The codes documented in this report are preliminary and upon coder review may  be revised to meet current compliance requirements. Juanita Craver, MD Juanita Craver, MD 09/08/2017 8:03:40 AM This report has been signed electronically. Number of Addenda: 0

## 2017-09-08 NOTE — Op Note (Signed)
[facility] Patient Name: Michaela Kennedy Procedure Date: 09/08/2017 MRN: [id number] Attending MD: Juanita Craver , MD Date of Birth: [birth year] CSN: [id number] Age: 57 Admit Type: Outpatient Procedure:                Diagnostic EGD. Indications:              Dysphagia, Gastro-esophageal reflux disease Providers:                Juanita Craver, MD, Cleda Daub, RN, Alan Mulder,                            Technician, Glenis Smoker, CRNA. Referring MD:             Aletha Halim PA-C Medicines:                Monitored Anesthesia Care Complications:            No immediate complications. Estimated Blood Loss:     Estimated blood loss: none. Procedure:                Pre-anesthesia assessment: - Prior to the                            procedure, a history and physical was performed,                            and patient medications and allergies were                            reviewed. The patient's tolerance of previous                            anesthesia was also reviewed. The risks and                            benefits of the procedure and the sedation options                            and risks were discussed with the patient. All                            questions were answered, and informed consent was                            obtained. Prior anticoagulants: The patient has                            taken no previous anticoagulant or antiplatelet                            agents. ASA Grade Assessment: III - A patient with                            severe systemic disease. After reviewing the risks  and benefits, the patient was deemed in                            satisfactory condition to undergo the procedure.                            After obtaining informed consent, the endoscope was                            passed under direct vision. Throughout the                            procedure, the patient's blood pressure,  pulse, and                            oxygen saturations were monitored continuously. The                            [id number] ([id number]) scope was introduced through the                            mouth, and advanced to the second part of duodenum.                            The EGD was accomplished without difficulty. The                            patient tolerated the procedure well. Scope In: Scope Out: Findings:      The lumen of the esophagus was severely dilated; the esophageal mucosa       appeared normal; widely patent GEJ at 40 cm .      The entire examined stomach was normal.      The cardia and gastric fundus were normal on retroflexion.      The examined duodenum was normal. Impression:               - Significantly dilated esophagus; widely patent                            GEJ.                           - Normal stomach.                           - Normal examined duodenum.                           - No specimens collected. Moderate Sedation:      MAC used. Recommendation:           - High fiber, low fat diet with augmented water                            consumption daily.                           -  Continue present medications.                           - Perform routine esophageal manometry at                            appointment to be scheduled. Procedure Code(s):        --- Professional ---                           [phone], Esophagogastroduodenoscopy, flexible,                            transoral; diagnostic, including collection of                            specimen(s) by brushing or washing, when performed                            (separate procedure) Diagnosis Code(s):        --- Professional ---                           R13.10, Dysphagia, unspecified                           K21.9, Gastro-esophageal reflux disease without                            esophagitis CPT copyright 2016 American [employer] Association. All rights reserved. The codes  documented in this report are preliminary and upon coder review may  be revised to meet current compliance requirements. Juanita Craver, MD Juanita Craver, MD 09/08/2017 7:39:30 AM This report has been signed electronically. Number of Addenda: 0

## 2017-09-09 ENCOUNTER — Encounter (HOSPITAL_COMMUNITY): Payer: Self-pay | Admitting: Gastroenterology

## 2017-09-18 ENCOUNTER — Ambulatory Visit (HOSPITAL_COMMUNITY)
Admission: RE | Admit: 2017-09-18 | Discharge: 2017-09-18 | Disposition: A | Discharge status: Home / Self Care | Payer: 59 | Source: Ambulatory Visit | Attending: Gastroenterology | Admitting: Gastroenterology

## 2017-09-18 ENCOUNTER — Encounter (HOSPITAL_COMMUNITY)
Admission: RE | Disposition: A | Discharge status: Home / Self Care | Payer: Self-pay | Source: Ambulatory Visit | Attending: Gastroenterology

## 2017-09-18 DIAGNOSIS — R1310 Dysphagia, unspecified: Secondary | ICD-10-CM | POA: Diagnosis present

## 2017-09-18 HISTORY — PX: ESOPHAGEAL MANOMETRY: SHX5429

## 2017-09-18 SURGERY — MANOMETRY, ESOPHAGUS
Anesthesia: LOCAL

## 2017-09-18 MED ORDER — LIDOCAINE VISCOUS 2 % MT SOLN
OROMUCOSAL | Status: AC
  Filled 2017-09-18: qty 15

## 2017-09-18 SURGICAL SUPPLY — 2 items
FACESHIELD LNG OPTICON STERILE (SAFETY) IMPLANT
GLOVE BIO SURGEON STRL SZ8 (GLOVE) ×4 IMPLANT

## 2017-09-18 NOTE — Progress Notes (Signed)
Attempted esophageal manometry. Pt got very anxious and would not allow probe to go past back of nasal passage. Attempted 4 or 5 times and each time patient insistently asked that probe be taken out. Pt attempted to grab probe each time. Tried to calm patient down but patient just stated she would not be able to tolerate this test.

## 2017-09-18 NOTE — Progress Notes (Signed)
Informed Michaela Kennedy at Dr. Lorie Apley office that patient could not tolerate the esophageal manometry.

## 2017-09-21 ENCOUNTER — Encounter (HOSPITAL_COMMUNITY): Payer: Self-pay

## 2017-09-21 ENCOUNTER — Encounter (HOSPITAL_COMMUNITY): Payer: Self-pay | Admitting: Gastroenterology

## 2017-09-21 ENCOUNTER — Ambulatory Visit (HOSPITAL_COMMUNITY): Admit: 2017-09-21 | Payer: 59 | Admitting: Gastroenterology

## 2017-09-21 SURGERY — MANOMETRY, ESOPHAGUS

## 2017-10-11 ENCOUNTER — Other Ambulatory Visit: Payer: Self-pay

## 2017-10-11 ENCOUNTER — Emergency Department (HOSPITAL_BASED_OUTPATIENT_CLINIC_OR_DEPARTMENT_OTHER): Payer: 59

## 2017-10-11 ENCOUNTER — Emergency Department (HOSPITAL_BASED_OUTPATIENT_CLINIC_OR_DEPARTMENT_OTHER)
Admission: EM | Admit: 2017-10-11 | Discharge: 2017-10-11 | Disposition: A | Discharge status: Home / Self Care | Payer: 59 | Attending: Emergency Medicine | Admitting: Emergency Medicine

## 2017-10-11 ENCOUNTER — Encounter (HOSPITAL_BASED_OUTPATIENT_CLINIC_OR_DEPARTMENT_OTHER): Payer: Self-pay | Admitting: Adult Health

## 2017-10-11 DIAGNOSIS — Z79899 Other long term (current) drug therapy: Secondary | ICD-10-CM | POA: Diagnosis not present

## 2017-10-11 DIAGNOSIS — I10 Essential (primary) hypertension: Secondary | ICD-10-CM | POA: Insufficient documentation

## 2017-10-11 DIAGNOSIS — R0789 Other chest pain: Secondary | ICD-10-CM | POA: Insufficient documentation

## 2017-10-11 DIAGNOSIS — R079 Chest pain, unspecified: Secondary | ICD-10-CM | POA: Diagnosis present

## 2017-10-11 DIAGNOSIS — Z7982 Long term (current) use of aspirin: Secondary | ICD-10-CM | POA: Diagnosis not present

## 2017-10-11 HISTORY — DX: Gastro-esophageal reflux disease without esophagitis: K21.9

## 2017-10-11 LAB — CBC
HEMATOCRIT: 39.3 % (ref 36.0–46.0)
HEMOGLOBIN: 13.4 g/dL (ref 12.0–15.0)
MCH: 30.7 pg (ref 26.0–34.0)
MCHC: 34.1 g/dL (ref 30.0–36.0)
MCV: 89.9 fL (ref 78.0–100.0)
Platelets: 253 10*3/uL (ref 150–400)
RBC: 4.37 MIL/uL (ref 3.87–5.11)
RDW: 13.1 % (ref 11.5–15.5)
WBC: 6.5 10*3/uL (ref 4.0–10.5)

## 2017-10-11 LAB — BASIC METABOLIC PANEL
ANION GAP: 8 (ref 5–15)
BUN: 23 mg/dL — ABNORMAL HIGH (ref 6–20)
CALCIUM: 9.1 mg/dL (ref 8.9–10.3)
CO2: 29 mmol/L (ref 22–32)
Chloride: 101 mmol/L (ref 101–111)
Creatinine, Ser: 0.76 mg/dL (ref 0.44–1.00)
GFR calc non Af Amer: >60 mL/min (ref >60)
GLUCOSE: 106 mg/dL — AB (ref 65–99)
POTASSIUM: 3.7 mmol/L (ref 3.5–5.1)
Sodium: 138 mmol/L (ref 135–145)

## 2017-10-11 LAB — TROPONIN I

## 2017-10-11 NOTE — ED Provider Notes (Signed)
[facility] EMERGENCY DEPARTMENT Provider Note   CSN: [id number] Arrival date & time: 10/11/17  1503     History   Chief Complaint Chief Complaint  Patient presents with  . Chest Pain    HPI Michaela Kennedy is a 57 y.o. female.  The history is provided by the patient. No language interpreter was used.  Chest Pain      Michaela Kennedy is a 57 y.o. female who presents to the Emergency Department complaining of chest pain/back pain.  She reports 3 days of mid thoracic back pain described as a constant soreness.  She has associated upper chest soreness with bending over to pick up items.  She denies any associated fevers, nausea, vomiting, abdominal pain, diaphoresis, shortness of breath, leg swelling or pain.  She does have a history of hypertension and reflux.  A couple days before her pain began she did try to lift some light weights for a few minutes.  No prior similar symptoms.  She came in today because she was starting to feel anxious about the symptoms and is worried she might be having a heart attack.  Past Medical History:  Diagnosis Date  . GERD (gastroesophageal reflux disease)   . Hypertension   . Obesity     Patient Active Problem List   Diagnosis Date Noted  . OSA (obstructive sleep apnea) 11/19/2016  . Hypersomnia 12/05/2015  . Morbid obesity ([place]) 12/05/2015  . Exertional dyspnea 12/05/2015    Past Surgical History:  Procedure Laterality Date  . ABDOMINAL HYSTERECTOMY  02/25/2011   Procedure: HYSTERECTOMY ABDOMINAL;  Surgeon: Catha Brow;  Location: [facility];  Service: Gynecology;  Laterality: N/A;  Supercervical Hysterectomy with Cystoscopy  . COLONOSCOPY WITH PROPOFOL N/A 09/08/2017   Procedure: COLONOSCOPY WITH PROPOFOL;  Surgeon: Juanita Craver, MD;  Location: [facility];  Service: Endoscopy;  Laterality: N/A;  . ESOPHAGEAL MANOMETRY N/A 09/18/2017   Procedure: ESOPHAGEAL MANOMETRY (EM);  Surgeon: Juanita Craver, MD;  Location: [facility];  Service:  Endoscopy;  Laterality: N/A;  . ESOPHAGOGASTRODUODENOSCOPY (EGD) WITH PROPOFOL N/A 09/08/2017   Procedure: ESOPHAGOGASTRODUODENOSCOPY (EGD) WITH PROPOFOL;  Surgeon: Juanita Craver, MD;  Location: [facility];  Service: Endoscopy;  Laterality: N/A;  . LAPAROSCOPIC GASTRIC BANDING    . MEDIAL PARTIAL KNEE REPLACEMENT      OB History    No data available       Home Medications    Prior to Admission medications   Medication Sig Start Date End Date Taking? Authorizing Provider  ALPRAZolam Duanne Moron) 0.25 MG tablet Take 0.25 mg by mouth daily as needed for anxiety. 08/12/17   [provider]  amLODipine (NORVASC) 5 MG tablet Take 5 mg by mouth daily.      [provider]  aspirin EC 81 MG tablet Take 81 mg by mouth daily.    [provider]  bisoprolol-hydrochlorothiazide (ZIAC) 10-6.25 MG per tablet Take 1 tablet by mouth daily.    [provider]  cyclobenzaprine (FLEXERIL) 10 MG tablet Take 10 mg by mouth 2 (two) times daily as needed for muscle spasms.     [provider]  dexlansoprazole (DEXILANT) 60 MG capsule Take 60 mg by mouth daily.    [provider]  hydrochlorothiazide 25 MG tablet Take 25 mg by mouth daily.      [provider]  valACYclovir (VALTREX) 1000 MG tablet Take 2 g by mouth 2 (two) times daily as needed (for out breaks).  08/10/17   [provider]    Family History Family History  Problem Relation Age of Onset  . Sleep apnea Other   . Obesity Other   . Hypertension Other   . Breast cancer Neg Hx     Social History Social History   Tobacco Use  . Smoking status: Never Smoker  . Smokeless tobacco: Never Used  Substance Use Topics  . Alcohol use: Yes    Comment: occ  . Drug use: No     Allergies   Lisinopril and Hydrocodone-acetaminophen   Review of Systems Review of Systems  Cardiovascular: Positive for chest pain.  All other systems reviewed and are negative.    Physical  Exam Updated Vital Signs BP 140/88   Pulse (!) 55   Temp 98.3 F (36.8 C) (Oral)   Resp 18   Ht 5\' 5"  (1.651 m)   Wt 108.9 kg (240 lb)   LMP 01/19/2011   SpO2 98%   BMI 39.94 kg/m   Physical Exam  Constitutional: She is oriented to person, place, and time. She appears well-developed and well-nourished.  HENT:  Head: Normocephalic and atraumatic.  Cardiovascular: Normal rate and regular rhythm.  No murmur heard. Pulmonary/Chest: Effort normal and breath sounds normal. No respiratory distress. She exhibits no tenderness.  Abdominal: Soft. There is no tenderness. There is no rebound and no guarding.  Musculoskeletal: She exhibits no edema or tenderness.  Midthoracic back feels "better" with palpation.  No muscular tenderness to palpation in the back.  Neurological: She is alert and oriented to person, place, and time.  Skin: Skin is warm and dry.  Psychiatric: She has a normal mood and affect. Her behavior is normal.  Nursing note and vitals reviewed.    ED Treatments / Results  Labs (all labs ordered are listed, but only abnormal results are displayed) Labs Reviewed  BASIC METABOLIC PANEL - Abnormal; Notable for the following components:      Result Value   Glucose, Bld 106 (*)    BUN 23 (*)    All other components within normal limits  CBC  TROPONIN I    EKG  EKG Interpretation  Date/Time:  Sunday October 11 2017 15:19:16 EST Ventricular Rate:  61 PR Interval:  182 QRS Duration: 90 QT Interval:  434 QTC Calculation: 436 R Axis:   6 Text Interpretation:  Normal sinus rhythm Normal ECG Confirmed by Quintella Reichert [phone]) on 10/11/2017 5:30:49 PM       Radiology Dg Chest 2 View  Result Date: 10/11/2017 CLINICAL DATA:  Chest pain, shortness of breath. EXAM: CHEST  2 VIEW COMPARISON:  Radiographs of January 29, 2011. FINDINGS: The heart size and mediastinal contours are within normal limits. Both lungs are clear. No pneumothorax or pleural effusion is noted. The  visualized skeletal structures are unremarkable. IMPRESSION: No active cardiopulmonary disease. Electronically Signed   By: Marijo Conception, M.D.   On: 10/11/2017 15:42    Procedures Procedures (including critical care time)  Medications Ordered in ED Medications - No data to display   Initial Impression / Assessment and Plan / ED Course  I have reviewed the triage vital signs and the nursing notes.  Pertinent labs & imaging results that were available during my care of the patient were reviewed by me and considered in my medical decision making (see chart for details).     Patient with history of hypertension here for evaluation of midthoracic back pain as well as upper chest pain for the last several days.  She is nontoxic appearing on examination and in no acute distress.  Given recent exercise and reproducible nature of her symptoms with positioning and movement feel musculoskeletal pain is most likely diagnosis.  Presentation is not consistent with ACS, PE, dissection.  Counseled patient on home care for musculoskeletal chest pain with ibuprofen, activity as tolerated, oral fluid hydration.  Discussed outpatient follow-up and return precautions.  Final Clinical Impressions(s) / ED Diagnoses   Final diagnoses:  Chest wall pain    ED Discharge Orders    None       Quintella Reichert, MD 10/11/17 1754

## 2017-10-11 NOTE — ED Notes (Signed)
Patient refused to get into a gown and sit in the bed and get on montior because she states " I have been her too long, I just want my test results and to get out of here" Patient told that since she had chest pain we would want to put her on the monitor to which patient replies " I have been here so long I dont have chest pain any longer"

## 2017-10-11 NOTE — ED Triage Notes (Signed)
PResents with pain between the shoulder blades that began today associated with SOB and painin chest with bedning forward. SHe states she lifted weights 4 days ago and it was the first time she had lifted. SHe is very anxious.

## 2018-08-07 IMAGING — CR DG CHEST 2V
2 series · 2 of 2 positions shown · non-contrast
Comparison: Radiographs January 29, 2011.

CLINICAL DATA: Chest pain, shortness of breath.

EXAM:
CHEST  2 VIEW

[w chest pa]
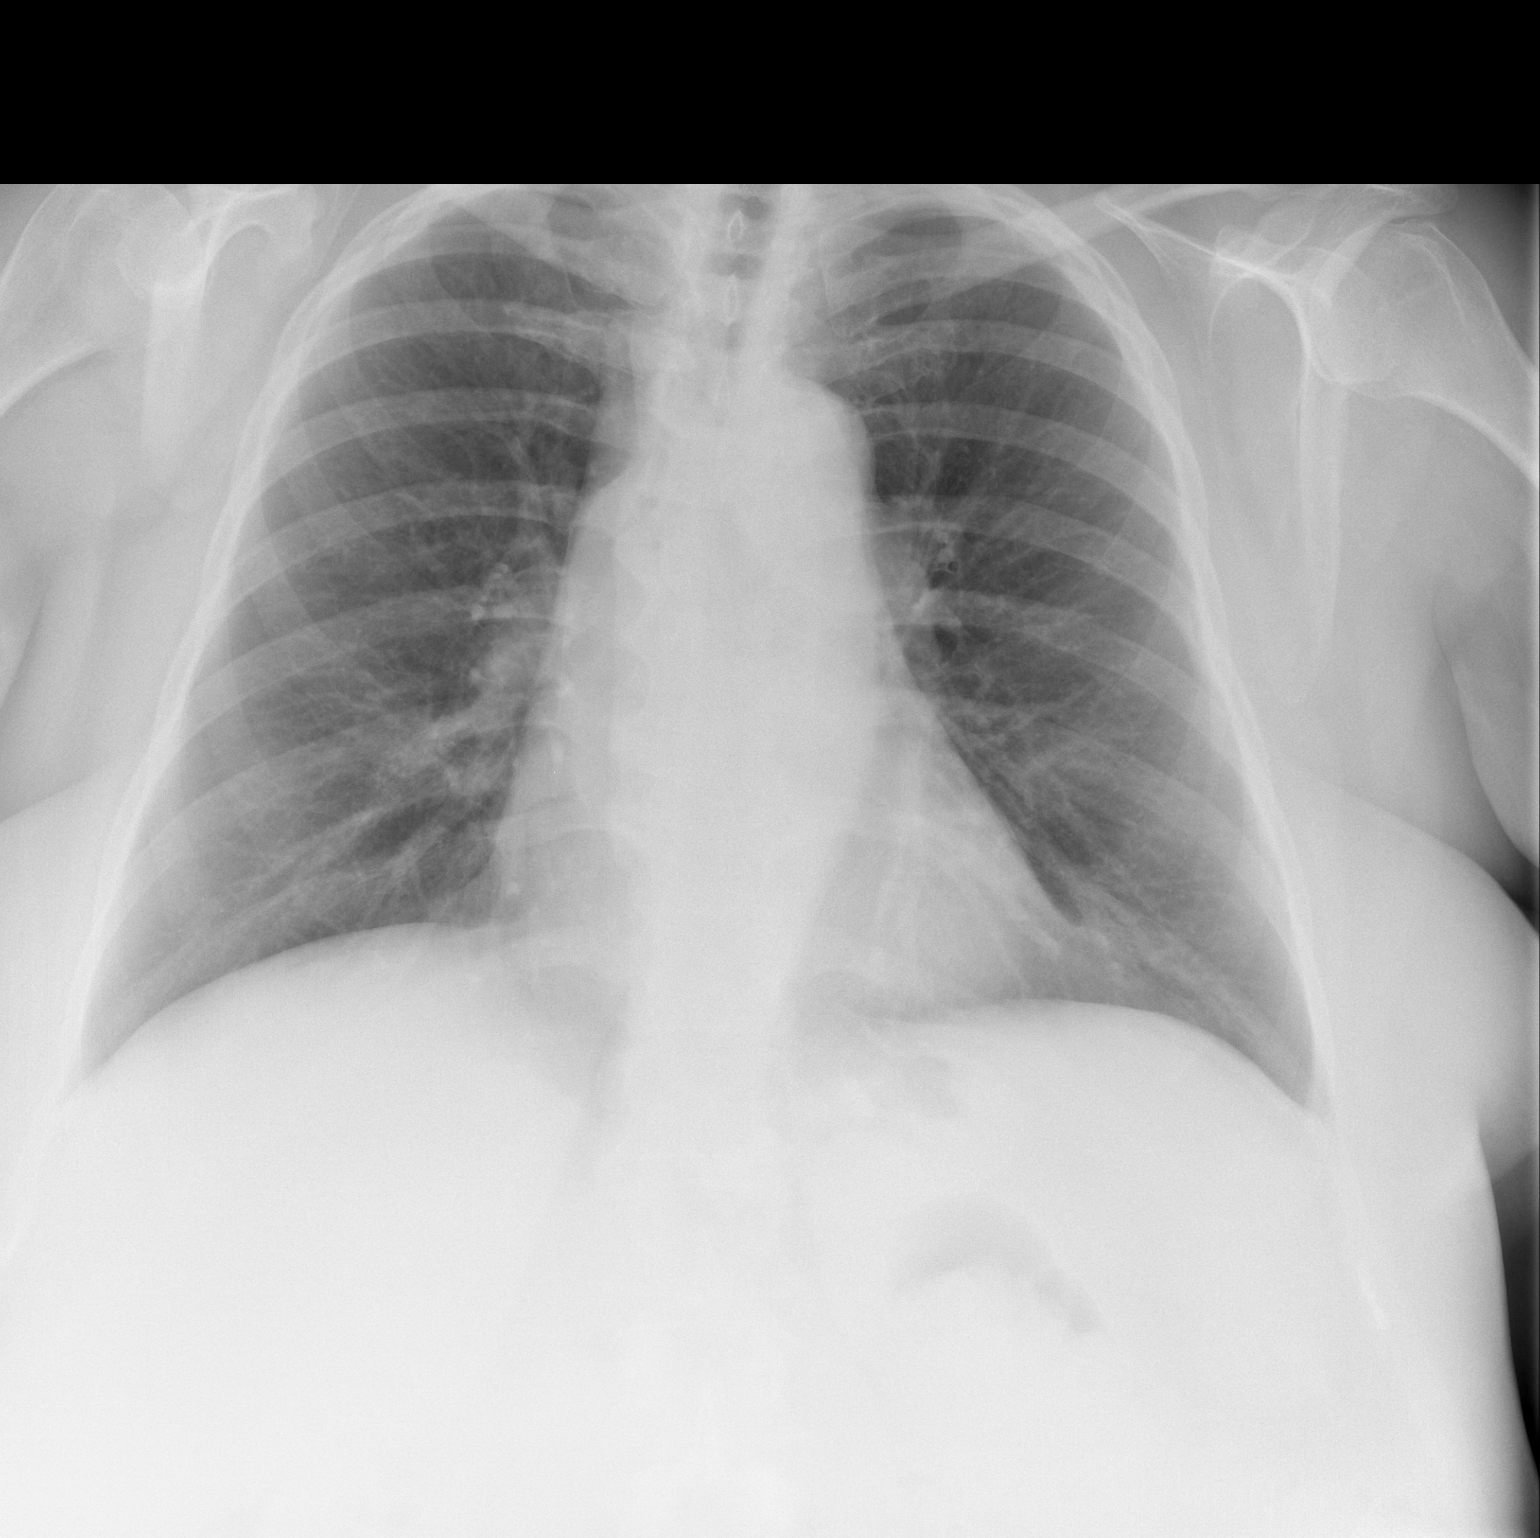

[w chest lat]
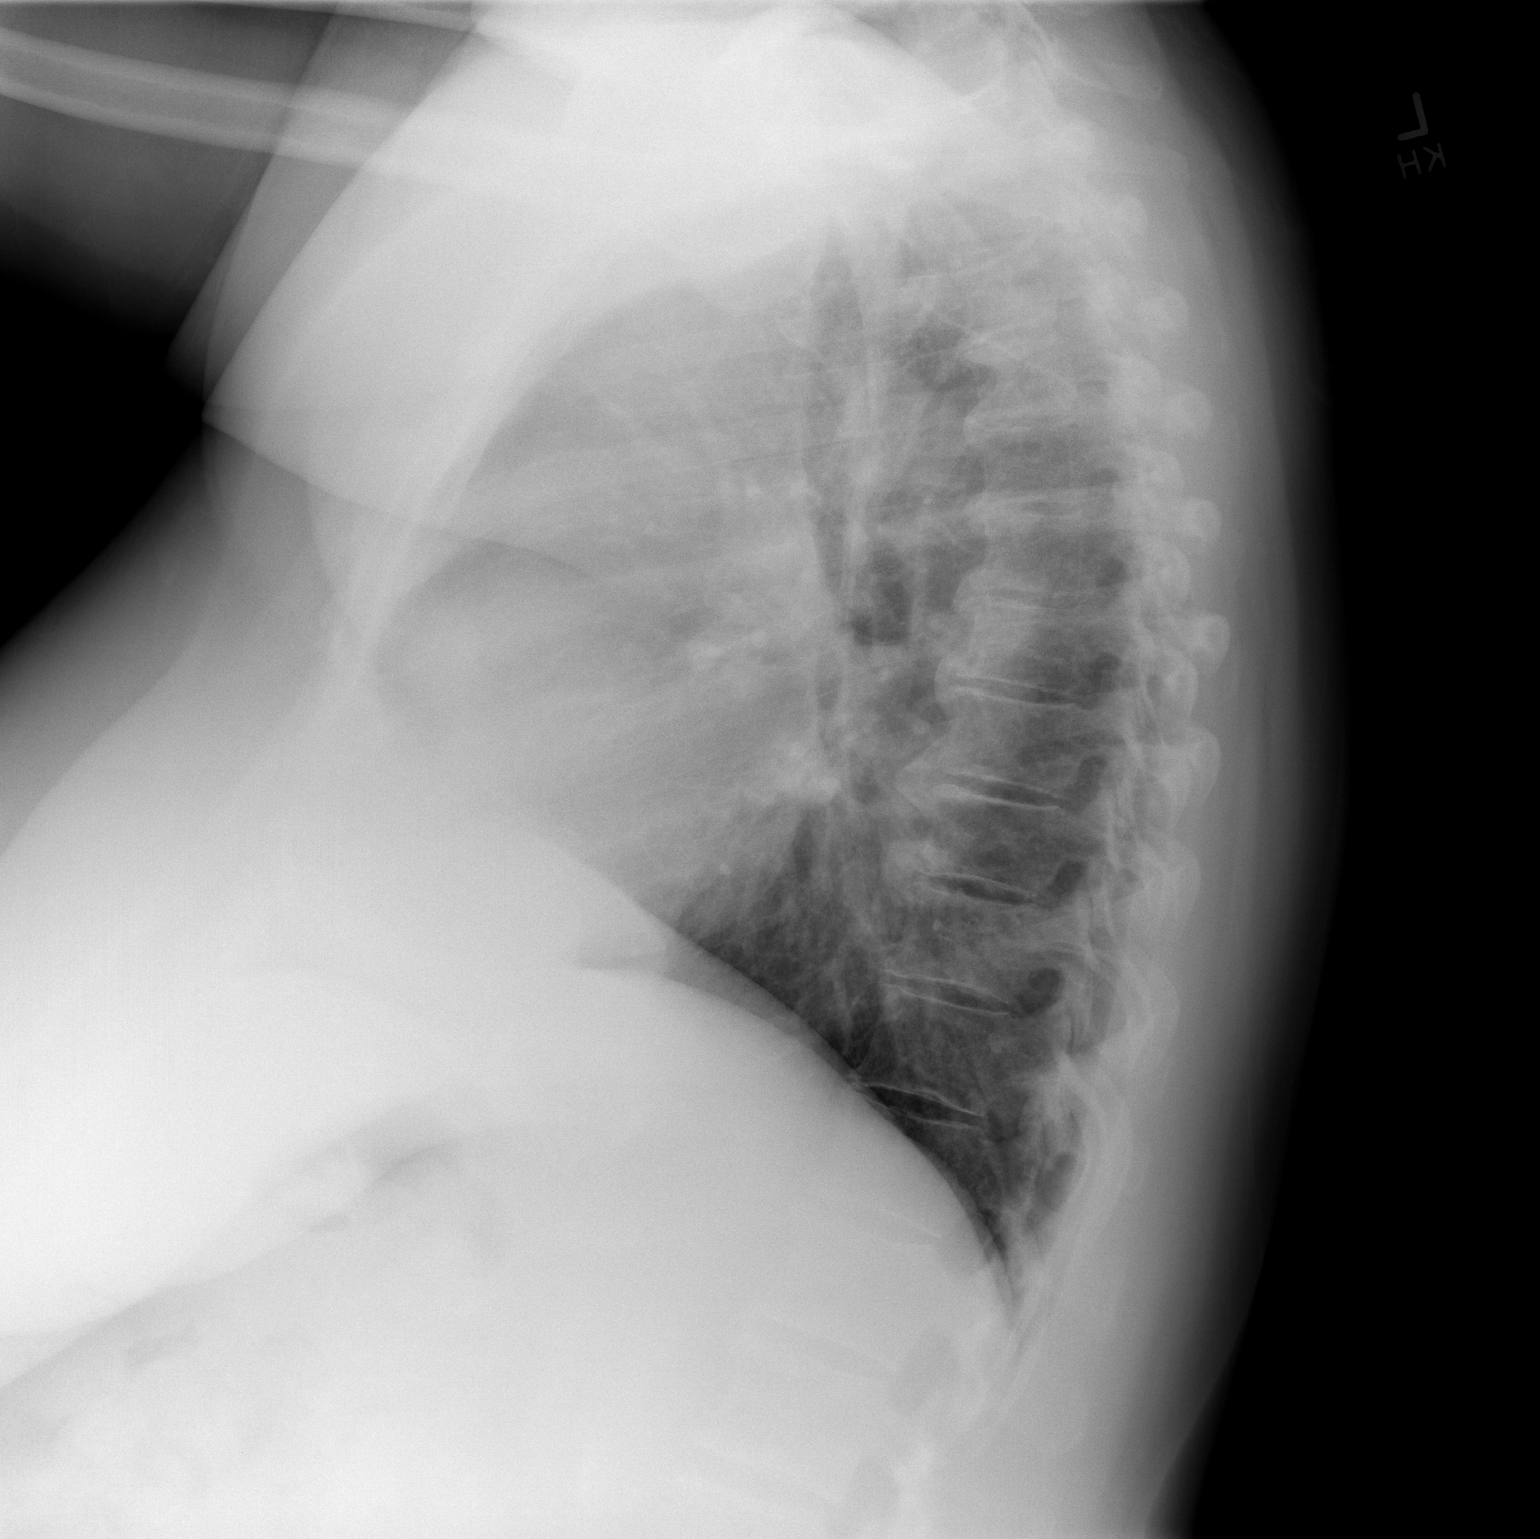

[2 of 2 positions shown; findings below may reference images not displayed]

FINDINGS: The heart size and mediastinal contours are within normal limits.
Both lungs are clear. No pneumothorax or pleural effusion is noted.
The visualized skeletal structures are unremarkable.
IMPRESSION: No active cardiopulmonary disease.

## 2018-10-11 ENCOUNTER — Emergency Department (HOSPITAL_BASED_OUTPATIENT_CLINIC_OR_DEPARTMENT_OTHER): Payer: BLUE CROSS/BLUE SHIELD

## 2018-10-11 ENCOUNTER — Emergency Department (HOSPITAL_BASED_OUTPATIENT_CLINIC_OR_DEPARTMENT_OTHER)
Admission: EM | Admit: 2018-10-11 | Discharge: 2018-10-11 | Disposition: A | Discharge status: Home / Self Care | Payer: BLUE CROSS/BLUE SHIELD | Attending: Emergency Medicine | Admitting: Emergency Medicine

## 2018-10-11 ENCOUNTER — Encounter (HOSPITAL_BASED_OUTPATIENT_CLINIC_OR_DEPARTMENT_OTHER): Payer: Self-pay | Admitting: Emergency Medicine

## 2018-10-11 ENCOUNTER — Other Ambulatory Visit: Payer: Self-pay

## 2018-10-11 DIAGNOSIS — M542 Cervicalgia: Secondary | ICD-10-CM | POA: Insufficient documentation

## 2018-10-11 DIAGNOSIS — R51 Headache: Secondary | ICD-10-CM | POA: Insufficient documentation

## 2018-10-11 DIAGNOSIS — R0602 Shortness of breath: Secondary | ICD-10-CM | POA: Insufficient documentation

## 2018-10-11 DIAGNOSIS — R200 Anesthesia of skin: Secondary | ICD-10-CM | POA: Diagnosis not present

## 2018-10-11 DIAGNOSIS — R202 Paresthesia of skin: Secondary | ICD-10-CM

## 2018-10-11 DIAGNOSIS — R7989 Other specified abnormal findings of blood chemistry: Secondary | ICD-10-CM

## 2018-10-11 DIAGNOSIS — R945 Abnormal results of liver function studies: Secondary | ICD-10-CM | POA: Diagnosis not present

## 2018-10-11 DIAGNOSIS — I10 Essential (primary) hypertension: Secondary | ICD-10-CM | POA: Diagnosis not present

## 2018-10-11 DIAGNOSIS — Z7982 Long term (current) use of aspirin: Secondary | ICD-10-CM | POA: Insufficient documentation

## 2018-10-11 DIAGNOSIS — Z79899 Other long term (current) drug therapy: Secondary | ICD-10-CM | POA: Insufficient documentation

## 2018-10-11 LAB — COMPREHENSIVE METABOLIC PANEL
ALT: 103 U/L — ABNORMAL HIGH (ref 0–44)
ANION GAP: 9 (ref 5–15)
AST: 83 U/L — ABNORMAL HIGH (ref 15–41)
Albumin: 4.1 g/dL (ref 3.5–5.0)
Alkaline Phosphatase: 74 U/L (ref 38–126)
BUN: 21 mg/dL — ABNORMAL HIGH (ref 6–20)
CO2: 27 mmol/L (ref 22–32)
Calcium: 8.7 mg/dL — ABNORMAL LOW (ref 8.9–10.3)
Chloride: 102 mmol/L (ref 98–111)
Creatinine, Ser: 0.83 mg/dL (ref 0.44–1.00)
GFR calc Af Amer: >60 mL/min (ref >60)
GFR calc non Af Amer: >60 mL/min (ref >60)
Glucose, Bld: 148 mg/dL — ABNORMAL HIGH (ref 70–99)
Potassium: 3.5 mmol/L (ref 3.5–5.1)
Sodium: 138 mmol/L (ref 135–145)
Total Bilirubin: 0.6 mg/dL (ref 0.3–1.2)
Total Protein: 7.2 g/dL (ref 6.5–8.1)

## 2018-10-11 LAB — CBC
HEMATOCRIT: 42.5 % (ref 36.0–46.0)
Hemoglobin: 13.9 g/dL (ref 12.0–15.0)
MCH: 30 pg (ref 26.0–34.0)
MCHC: 32.7 g/dL (ref 30.0–36.0)
MCV: 91.8 fL (ref 80.0–100.0)
Platelets: 272 10*3/uL (ref 150–400)
RBC: 4.63 MIL/uL (ref 3.87–5.11)
RDW: 12.5 % (ref 11.5–15.5)
WBC: 7.1 10*3/uL (ref 4.0–10.5)
nRBC: 0 % (ref 0.0–0.2)

## 2018-10-11 LAB — PROTIME-INR
INR: 1 (ref 0.8–1.2)
Prothrombin Time: 12.7 seconds (ref 11.4–15.2)

## 2018-10-11 LAB — TROPONIN I: Troponin I: <0.03 ng/mL (ref <0.03)

## 2018-10-11 LAB — DIFFERENTIAL
Abs Immature Granulocytes: 0.02 10*3/uL (ref 0.00–0.07)
BASOS ABS: 0.1 10*3/uL (ref 0.0–0.1)
Basophils Relative: 1 %
Eosinophils Absolute: 0.1 10*3/uL (ref 0.0–0.5)
Eosinophils Relative: 2 %
Immature Granulocytes: 0 %
Lymphocytes Relative: 17 %
Lymphs Abs: 1.2 10*3/uL (ref 0.7–4.0)
Monocytes Absolute: 0.6 10*3/uL (ref 0.1–1.0)
Monocytes Relative: 9 %
Neutro Abs: 5 10*3/uL (ref 1.7–7.7)
Neutrophils Relative %: 71 %

## 2018-10-11 LAB — CBG MONITORING, ED: Glucose-Capillary: 148 mg/dL — ABNORMAL HIGH (ref 70–99)

## 2018-10-11 LAB — MAGNESIUM: Magnesium: 1.9 mg/dL (ref 1.7–2.4)

## 2018-10-11 LAB — APTT: aPTT: 27 seconds (ref 24–36)

## 2018-10-11 LAB — PREGNANCY, URINE: Preg Test, Ur: NEGATIVE

## 2018-10-11 MED ORDER — IOPAMIDOL (ISOVUE-370) INJECTION 76%
100.0000 mL | Freq: Once | INTRAVENOUS | Status: AC | PRN
  Administered 2018-10-11: 100 mL via INTRAVENOUS

## 2018-10-11 NOTE — ED Notes (Signed)
Pt taken to CT Scan.

## 2018-10-11 NOTE — ED Notes (Signed)
ED Provider at bedside. 

## 2018-10-11 NOTE — ED Notes (Signed)
Pt refuses IV access, despite repeated explanations and reassurances from this rn. Pt states "you can draw my blood, but I don't want no iv!" phlebotomy only performed.

## 2018-10-11 NOTE — ED Notes (Signed)
E-signature not working.  Will restart computer. Pt verbalizes understanding.

## 2018-10-11 NOTE — ED Provider Notes (Signed)
[facility]PART[facility] Provider Note   CSN: [id number] Arrival date & time: 10/11/18  1339    History   Chief Complaint Chief Complaint  Patient presents with  . Numbness    HPI Michaela Kennedy is a 58 y.o. female.     The history is provided by the patient, a significant other and medical records. No language interpreter was used.  Neurologic Problem  This is a new problem. The current episode started 3 to 5 hours ago. The problem occurs rarely. The problem has been resolved. Pertinent negatives include no chest pain, no abdominal pain, no headaches and no shortness of breath. Associated symptoms comments: Neck pain. Nothing aggravates the symptoms. Nothing relieves the symptoms. She has tried nothing for the symptoms. The treatment provided no relief.    Past Medical History:  Diagnosis Date  . GERD (gastroesophageal reflux disease)   . Hypertension   . Obesity     Patient Active Problem List   Diagnosis Date Noted  . OSA (obstructive sleep apnea) 11/19/2016  . Hypersomnia 12/05/2015  . Morbid obesity ([place]) 12/05/2015  . Exertional dyspnea 12/05/2015    Past Surgical History:  Procedure Laterality Date  . ABDOMINAL HYSTERECTOMY  02/25/2011   Procedure: HYSTERECTOMY ABDOMINAL;  Surgeon: Catha Brow;  Location: [facility];  Service: Gynecology;  Laterality: N/A;  Supercervical Hysterectomy with Cystoscopy  . COLONOSCOPY WITH PROPOFOL N/A 09/08/2017   Procedure: COLONOSCOPY WITH PROPOFOL;  Surgeon: Juanita Craver, MD;  Location: [facility];  Service: Endoscopy;  Laterality: N/A;  . ESOPHAGEAL MANOMETRY N/A 09/18/2017   Procedure: ESOPHAGEAL MANOMETRY (EM);  Surgeon: Juanita Craver, MD;  Location: [facility];  Service: Endoscopy;  Laterality: N/A;  . ESOPHAGOGASTRODUODENOSCOPY (EGD) WITH PROPOFOL N/A 09/08/2017   Procedure: ESOPHAGOGASTRODUODENOSCOPY (EGD) WITH PROPOFOL;  Surgeon: Juanita Craver, MD;  Location: [facility];  Service: Endoscopy;  Laterality:  N/A;  . LAPAROSCOPIC GASTRIC BANDING    . MEDIAL PARTIAL KNEE REPLACEMENT       OB History   No obstetric history on file.      Home Medications    Prior to Admission medications   Medication Sig Start Date End Date Taking? Authorizing Provider  ALPRAZolam Duanne Moron) 0.25 MG tablet Take 0.25 mg by mouth daily as needed for anxiety. 08/12/17   [provider]  amLODipine (NORVASC) 5 MG tablet Take 5 mg by mouth daily.      [provider]  aspirin EC 81 MG tablet Take 81 mg by mouth daily.    [provider]  bisoprolol-hydrochlorothiazide (ZIAC) 10-6.25 MG per tablet Take 1 tablet by mouth daily.    [provider]  cyclobenzaprine (FLEXERIL) 10 MG tablet Take 10 mg by mouth 2 (two) times daily as needed for muscle spasms.     [provider]  dexlansoprazole (DEXILANT) 60 MG capsule Take 60 mg by mouth daily.    [provider]  hydrochlorothiazide 25 MG tablet Take 25 mg by mouth daily.      [provider]  valACYclovir (VALTREX) 1000 MG tablet Take 2 g by mouth 2 (two) times daily as needed (for out breaks).  08/10/17   [provider]    Family History Family History  Problem Relation Age of Onset  . Sleep apnea Other   . Obesity Other   . Hypertension Other   . Breast cancer Neg Hx     Social History Social History   Tobacco Use  . Smoking status: Never Smoker  .  Smokeless tobacco: Never Used  Substance Use Topics  . Alcohol use: Yes    Comment: occ  . Drug use: No     Allergies   Lisinopril and Hydrocodone-acetaminophen   Review of Systems Review of Systems  Constitutional: Negative for chills, diaphoresis, fatigue and fever.  HENT: Negative for congestion and rhinorrhea.   Eyes: Negative for visual disturbance.  Respiratory: Negative for cough, chest tightness, shortness of breath, wheezing and stridor.   Cardiovascular: Negative for chest pain, palpitations and leg swelling.    Gastrointestinal: Negative for abdominal pain, constipation, diarrhea, nausea and vomiting.  Genitourinary: Negative for dysuria and flank pain.  Musculoskeletal: Positive for neck pain. Negative for back pain, gait problem and neck stiffness.  Skin: Negative for rash and wound.  Neurological: Positive for numbness. Negative for dizziness, seizures, syncope, facial asymmetry, speech difficulty, weakness, light-headedness and headaches.  Psychiatric/Behavioral: Negative for agitation and confusion.  All other systems reviewed and are negative.    Physical Exam Updated Vital Signs BP (!) 159/82 (BP Location: Left Arm)   Pulse 70   Temp 98.6 F (37 C) (Oral)   Resp 16   Ht 5\' 3"  (1.6 m)   Wt 108.9 kg   LMP 01/19/2011   SpO2 96%   BMI 42.51 kg/m   Physical Exam Vitals signs and nursing note reviewed.  Constitutional:      General: She is not in acute distress.    Appearance: She is well-developed. She is not ill-appearing, toxic-appearing or diaphoretic.  HENT:     Head: Normocephalic and atraumatic.     Nose: Nose normal. No congestion or rhinorrhea.     Mouth/Throat:     Pharynx: No oropharyngeal exudate or posterior oropharyngeal erythema.  Eyes:     Extraocular Movements: Extraocular movements intact.     Conjunctiva/sclera: Conjunctivae normal.     Pupils: Pupils are equal, round, and reactive to light.  Neck:     Musculoskeletal: Neck supple. No neck rigidity or muscular tenderness.     Vascular: No carotid bruit.  Cardiovascular:     Rate and Rhythm: Normal rate and regular rhythm.     Pulses: Normal pulses.     Heart sounds: No murmur.  Pulmonary:     Effort: Pulmonary effort is normal. No respiratory distress.     Breath sounds: Normal breath sounds. No wheezing, rhonchi or rales.  Chest:     Chest wall: No tenderness.  Abdominal:     Palpations: Abdomen is soft.     Tenderness: There is no abdominal tenderness. There is no right CVA tenderness or left CVA  tenderness.  Musculoskeletal:        General: No tenderness.     Right lower leg: No edema.     Left lower leg: No edema.  Lymphadenopathy:     Cervical: No cervical adenopathy.  Skin:    General: Skin is warm and dry.     Capillary Refill: Capillary refill takes less than 2 seconds.     Findings: No erythema or rash.  Neurological:     General: No focal deficit present.     Mental Status: She is alert and oriented to person, place, and time.     Cranial Nerves: No cranial nerve deficit.     Sensory: No sensory deficit.     Motor: No weakness.     Coordination: Coordination normal.  Psychiatric:        Mood and Affect: Mood normal.      ED  Treatments / Results  Labs (all labs ordered are listed, but only abnormal results are displayed) Labs Reviewed  COMPREHENSIVE METABOLIC PANEL - Abnormal; Notable for the following components:      Result Value   Glucose, Bld 148 (*)    BUN 21 (*)    Calcium 8.7 (*)    AST 83 (*)    ALT 103 (*)    All other components within normal limits  CBG MONITORING, ED - Abnormal; Notable for the following components:   Glucose-Capillary 148 (*)    All other components within normal limits  PROTIME-INR  APTT  CBC  DIFFERENTIAL  TROPONIN I  MAGNESIUM  PREGNANCY, URINE  TROPONIN I  CBG MONITORING, ED    EKG EKG Interpretation  Date/Time:  Monday October 11 2018 13:49:17 EST Ventricular Rate:  69 PR Interval:  148 QRS Duration: 86 QT Interval:  438 QTC Calculation: 469 R Axis:   23 Text Interpretation:  Normal sinus rhythm Normal ECG When compared to prior, no significant changes seen.  No STEMI Confirmed by Antony Blackbird [phone]) on 10/11/2018 2:20:54 PM   Radiology Ct Angio Head W Or Wo Contrast  Result Date: 10/11/2018 CLINICAL DATA:  Headache and left arm numbness. EXAM: CT ANGIOGRAPHY HEAD AND NECK TECHNIQUE: Multidetector CT imaging of the head and neck was performed using the standard protocol during bolus administration of  intravenous contrast. Multiplanar CT image reconstructions and MIPs were obtained to evaluate the vascular anatomy. Carotid stenosis measurements (when applicable) are obtained utilizing NASCET criteria, using the distal internal carotid diameter as the denominator. CONTRAST:  122mL ISOVUE-370 IOPAMIDOL (ISOVUE-370) INJECTION 76% COMPARISON:  None. FINDINGS: CT HEAD FINDINGS Brain: There is no mass, hemorrhage or extra-axial collection. The size and configuration of the ventricles and extra-axial CSF spaces are normal. There is no acute or chronic infarction. The brain parenchyma is normal. Partially empty sella incidentally noted. Skull: The visualized skull base, calvarium and extracranial soft tissues are normal. Sinuses/Orbits: No fluid levels or advanced mucosal thickening of the visualized paranasal sinuses. No mastoid or middle ear effusion. The orbits are normal. CTA NECK FINDINGS SKELETON: There is no bony spinal canal stenosis. No lytic or blastic lesion. OTHER NECK: Normal pharynx, larynx and major salivary glands. No cervical lymphadenopathy. Unremarkable thyroid gland. UPPER CHEST: No pneumothorax or pleural effusion. No nodules or masses. AORTIC ARCH: There is no calcific atherosclerosis of the aortic arch. Ascending aorta measures 3.6 cm in diameter. Conventional 3 vessel aortic branching pattern. The visualized proximal subclavian arteries are widely patent. RIGHT CAROTID SYSTEM: --Common carotid artery: Widely patent origin without common carotid artery dissection or aneurysm. --Internal carotid artery: No dissection, occlusion or aneurysm. Mild atherosclerotic calcification at the carotid bifurcation without hemodynamically significant stenosis. --External carotid artery: No acute abnormality. LEFT CAROTID SYSTEM: --Common carotid artery: Widely patent origin without common carotid artery dissection or aneurysm. --Internal carotid artery: No dissection, occlusion or aneurysm. Mild atherosclerotic  calcification at the carotid bifurcation without hemodynamically significant stenosis. --External carotid artery: No acute abnormality. VERTEBRAL ARTERIES: Codominant configuration. Both origins are normal. No dissection, occlusion or flow-limiting stenosis to the vertebrobasilar confluence. CTA HEAD FINDINGS POSTERIOR CIRCULATION: --Basilar artery: Normal. --Posterior cerebral arteries: Normal. Both originate from the basilar artery. --Superior cerebellar arteries: Normal. --Inferior cerebellar arteries: Normal anterior and posterior inferior cerebellar arteries. ANTERIOR CIRCULATION: --Intracranial internal carotid arteries: Normal. --Anterior cerebral arteries: Normal. Both A1 segments are present. Patent anterior communicating artery. --Middle cerebral arteries: Normal. --Posterior communicating arteries: Present bilaterally. VENOUS SINUSES: As permitted  by contrast timing, patent. ANATOMIC VARIANTS: None DELAYED PHASE: No parenchymal contrast enhancement. Review of the MIP images confirms the above findings. IMPRESSION: 1. No emergent large vessel occlusion or hemodynamically significant stenosis of the arteries of the head and neck. 2. 3.6 cm ascending aorta. Recommend annual imaging followup by CTA or MRA. This recommendation follows 2010 ACCF/AHA/AATS/ACR/ASA/SCA/SCAI/SIR/STS/SVM Guidelines for the Diagnosis and Management of Patients with Thoracic Aortic Disease. Circulation. 2010; 121: Z482-L078. Electronically Signed   By: Ulyses Jarred M.D.   On: 10/11/2018 15:52   Dg Chest 2 View  Result Date: 10/11/2018 CLINICAL DATA:  Shortness of breath, dizziness, hypertension EXAM: CHEST - 2 VIEW COMPARISON:  None. FINDINGS: Cardiomegaly. Both lungs are clear. Disc degenerative disease of the thoracic spine. IMPRESSION: Cardiomegaly without acute abnormality of the lungs. Electronically Signed   By: Eddie Candle M.D.   On: 10/11/2018 15:40   Ct Angio Neck W And/or Wo Contrast  Result Date: 10/11/2018 CLINICAL  DATA:  Headache and left arm numbness. EXAM: CT ANGIOGRAPHY HEAD AND NECK TECHNIQUE: Multidetector CT imaging of the head and neck was performed using the standard protocol during bolus administration of intravenous contrast. Multiplanar CT image reconstructions and MIPs were obtained to evaluate the vascular anatomy. Carotid stenosis measurements (when applicable) are obtained utilizing NASCET criteria, using the distal internal carotid diameter as the denominator. CONTRAST:  160mL ISOVUE-370 IOPAMIDOL (ISOVUE-370) INJECTION 76% COMPARISON:  None. FINDINGS: CT HEAD FINDINGS Brain: There is no mass, hemorrhage or extra-axial collection. The size and configuration of the ventricles and extra-axial CSF spaces are normal. There is no acute or chronic infarction. The brain parenchyma is normal. Partially empty sella incidentally noted. Skull: The visualized skull base, calvarium and extracranial soft tissues are normal. Sinuses/Orbits: No fluid levels or advanced mucosal thickening of the visualized paranasal sinuses. No mastoid or middle ear effusion. The orbits are normal. CTA NECK FINDINGS SKELETON: There is no bony spinal canal stenosis. No lytic or blastic lesion. OTHER NECK: Normal pharynx, larynx and major salivary glands. No cervical lymphadenopathy. Unremarkable thyroid gland. UPPER CHEST: No pneumothorax or pleural effusion. No nodules or masses. AORTIC ARCH: There is no calcific atherosclerosis of the aortic arch. Ascending aorta measures 3.6 cm in diameter. Conventional 3 vessel aortic branching pattern. The visualized proximal subclavian arteries are widely patent. RIGHT CAROTID SYSTEM: --Common carotid artery: Widely patent origin without common carotid artery dissection or aneurysm. --Internal carotid artery: No dissection, occlusion or aneurysm. Mild atherosclerotic calcification at the carotid bifurcation without hemodynamically significant stenosis. --External carotid artery: No acute abnormality. LEFT  CAROTID SYSTEM: --Common carotid artery: Widely patent origin without common carotid artery dissection or aneurysm. --Internal carotid artery: No dissection, occlusion or aneurysm. Mild atherosclerotic calcification at the carotid bifurcation without hemodynamically significant stenosis. --External carotid artery: No acute abnormality. VERTEBRAL ARTERIES: Codominant configuration. Both origins are normal. No dissection, occlusion or flow-limiting stenosis to the vertebrobasilar confluence. CTA HEAD FINDINGS POSTERIOR CIRCULATION: --Basilar artery: Normal. --Posterior cerebral arteries: Normal. Both originate from the basilar artery. --Superior cerebellar arteries: Normal. --Inferior cerebellar arteries: Normal anterior and posterior inferior cerebellar arteries. ANTERIOR CIRCULATION: --Intracranial internal carotid arteries: Normal. --Anterior cerebral arteries: Normal. Both A1 segments are present. Patent anterior communicating artery. --Middle cerebral arteries: Normal. --Posterior communicating arteries: Present bilaterally. VENOUS SINUSES: As permitted by contrast timing, patent. ANATOMIC VARIANTS: None DELAYED PHASE: No parenchymal contrast enhancement. Review of the MIP images confirms the above findings. IMPRESSION: 1. No emergent large vessel occlusion or hemodynamically significant stenosis of the arteries of the head and  neck. 2. 3.6 cm ascending aorta. Recommend annual imaging followup by CTA or MRA. This recommendation follows 2010 ACCF/AHA/AATS/ACR/ASA/SCA/SCAI/SIR/STS/SVM Guidelines for the Diagnosis and Management of Patients with Thoracic Aortic Disease. Circulation. 2010; 121: N027-O536. Electronically Signed   By: Ulyses Jarred M.D.   On: 10/11/2018 15:52    Procedures Procedures (including critical care time)  Medications Ordered in ED Medications  iopamidol (ISOVUE-370) 76 % injection 100 mL (100 mLs Intravenous Contrast Given 10/11/18 1520)     Initial Impression / Assessment and Plan  / ED Course  I have reviewed the triage vital signs and the nursing notes.  Pertinent labs & imaging results that were available during my care of the patient were reviewed by me and considered in my medical decision making (see chart for details).        Michaela Kennedy is a 58 y.o. female with a past medical history significant for obesity, hypertension, and GERD who presents with transient left arm numbness and right neck pain.  Patient reports that she chronically has bilateral hand numbness that comes and goes.  She has exercises or physical therapy to help with it.  She reports that she woke up normal this morning and then after arriving to work at 9 AM she started having "a crick in my neck" with pain in the right posterior neck rating towards the occiput.  She reports after that she started having numbness in her left arm which is new.  She is never had the whole arm numbness in the past.  No weakness.  She denied headache, vision changes, nausea, vomiting, speech difficulty, facial droop, weakness, coordination abnormalities, or dizziness.  She denies recent fevers, chills, congestion, cough, urinary symptoms or GI symptoms.  She reports that she got a new dog recently and has been sleeping with her neck in different positions.  She thinks this may have caused the "crick in the neck". She reports her pain is mild to moderate in the neck and reports that that pain has improved since arrival.  She reports that she is having no numbness in her arm currently.  She denies other complaints.  On arrival, patient was examined in triage to make sure she was not having a stroke.  Patient denied any numbness or tingling on exam.  With her being at her baseline with no numbness, tingling, weakness or other focal neurologic deficits, code stroke was not initiated.  On exam, patient had normal neck range of motion.  Spurling test negative.  No tenderness in the neck.  Lungs clear chest nontender.  Abdomen  nontender.  Exam unremarkable with no numbness, tingling, weakness of extremities and normal coordination on finger-nose-finger testing bilaterally.  Patient thinks this may be more anxiety and her getting "worked up" for the numbness.  Clinical aspect patient may have pain in her neck from sleeping awkwardly and have some transient paresthesia that is benign however with the pain in the neck on the right and numbness in the left arm transiently, patient will work-up to look for vertebral dissection or other abnormality in the head.  Patient also reports that her family ever had a recent brain tumor and she wants to be evaluated for this.  Patient will have CT of her head and neck.  Patient advised that MRI is better for tumors however if there is a large tumor we would likely see it on CT.  Patient will have other screening labs.  If work-up is reassuring and patient has a negative delta  troponin due to the arm numbness, feel patient will be stable for discharge home and PCP follow-up.  Anticipate reassessment.   4:28 PM CTA of the head and neck show no dissection or intracranial normality.  Patient was found to have a 3.6 cm ascending aortic aneurysm which will need to follow-up with PCP for.  X-ray shows evidence of cardiomegaly.  Patient reports a family history of CHF however do not feel she has this today.   Patient reports she is still having no symptoms and if her delta troponin is negative, I feel she safe for discharge home.  Patient will follow-up with neurology for both her chronic transient hand numbness and then this transient arm paresthesia/numbness today.  I do not feel she had TIA or a cardiac cause of symptoms although she will have a delta troponin checked.  We had a shared [place] conversation we do not feel patient needs to be transferred for MRI at this time as we have a low suspicion for TIA.  Patient agreed with outpatient follow-up.  Care transferred to Dr. Stark Jock while awaiting  for delta troponin.  If it is negative, patient agrees with discharge.   Final Clinical Impressions(s) / ED Diagnoses   Final diagnoses:  Paresthesia  Numbness  Neck pain  LFT elevation     Clinical Impression: 1. Paresthesia   2. Numbness   3. Neck pain   4. LFT elevation     Disposition: Care transferred to Dr. Stark Jock while awaiting delta troponin.  Patient will follow-up with PCP, gastroenterologist for LFTs, and neurology for the numbness symptoms.  This note was prepared with assistance of Systems analyst. Occasional wrong-word or sound-a-like substitutions may have occurred due to the inherent limitations of voice recognition software.     Hartlee Amedee, Gwenyth Allegra, MD 10/11/18 1630

## 2018-10-11 NOTE — ED Triage Notes (Signed)
Reports left arm numbness which began at 0900 today.  Reports "I feel weird".  C/o shortness of breath, dizziness, denies difficulty with walking and speech.

## 2018-10-11 NOTE — ED Notes (Signed)
Pt updated on POC-awaiting until 6pm to draw trop

## 2018-10-11 NOTE — ED Notes (Signed)
After speaking with md pt agrees to allow iv access.

## 2018-10-11 NOTE — ED Notes (Signed)
Assist pt. To the bathroom

## 2018-10-11 NOTE — Discharge Instructions (Addendum)
Your work-up today was overall reassuring.  I suspect that your neck pain was related to has been holding her neck sleeping recently.  We did not find any significant abnormality in your head or your neck.  The slightly enlarged aorta and heart are things that you need to follow-up with your PCP but we do not feel they are causing her symptoms today.  I suspect your numbness is a transient numbness possibly related to the stress and anxiety you have been going through.  Your liver function was also slightly elevated today, please try to cut back on the drinking and stay hydrated.  Please avoid Tylenol for the neck several days.  We do not feel the arm numbness is related to a cardiac cause which is why we did the enzymes EKG and chest x-ray.  Your work-up there was overall reassuring.  If any symptoms change or worsen or develop new symptoms, please return to the nearest emergency department.

## 2018-10-11 NOTE — ED Provider Notes (Signed)
Care assumed from Dr. Sherry Ruffing at shift change.  Patient awaiting a second troponin.  This is returned and is negative.  The patient appears comfortable and appropriate for discharge.  She will follow-up with primary doctor.   Veryl Speak, MD 10/11/18 [phone]

## 2019-06-21 ENCOUNTER — Other Ambulatory Visit: Payer: Self-pay

## 2019-06-21 DIAGNOSIS — Z20822 Contact with and (suspected) exposure to covid-19: Secondary | ICD-10-CM

## 2019-06-22 LAB — NOVEL CORONAVIRUS, NAA: SARS-CoV-2, NAA: NOT DETECTED

## 2019-08-30 ENCOUNTER — Other Ambulatory Visit: Payer: Self-pay | Admitting: Family Medicine

## 2019-08-30 DIAGNOSIS — Z1231 Encounter for screening mammogram for malignant neoplasm of breast: Secondary | ICD-10-CM

## 2019-09-30 ENCOUNTER — Ambulatory Visit: Payer: BLUE CROSS/BLUE SHIELD

## 2019-10-25 ENCOUNTER — Ambulatory Visit
Admission: RE | Admit: 2019-10-25 | Discharge: 2019-10-25 | Disposition: A | Discharge status: Home / Self Care | Payer: BC Managed Care – PPO | Source: Ambulatory Visit | Attending: Family Medicine | Admitting: Family Medicine

## 2019-10-25 ENCOUNTER — Other Ambulatory Visit: Payer: Self-pay

## 2019-10-25 DIAGNOSIS — Z1231 Encounter for screening mammogram for malignant neoplasm of breast: Secondary | ICD-10-CM

## 2020-05-18 ENCOUNTER — Other Ambulatory Visit: Payer: BC Managed Care – PPO

## 2020-05-18 DIAGNOSIS — Z20822 Contact with and (suspected) exposure to covid-19: Secondary | ICD-10-CM

## 2020-05-20 LAB — NOVEL CORONAVIRUS, NAA: SARS-CoV-2, NAA: NOT DETECTED

## 2020-05-20 LAB — SARS-COV-2, NAA 2 DAY TAT

## 2020-08-20 IMAGING — MG DIGITAL SCREENING BILAT W/ TOMO W/ CAD
8 of 15 series · 8 of 40 positions shown · non-contrast
Comparison: Previous exam(s).

CLINICAL DATA: Screening.

EXAM:
DIGITAL SCREENING BILATERAL MAMMOGRAM WITH TOMO AND CAD

[L MLO synth-2D]
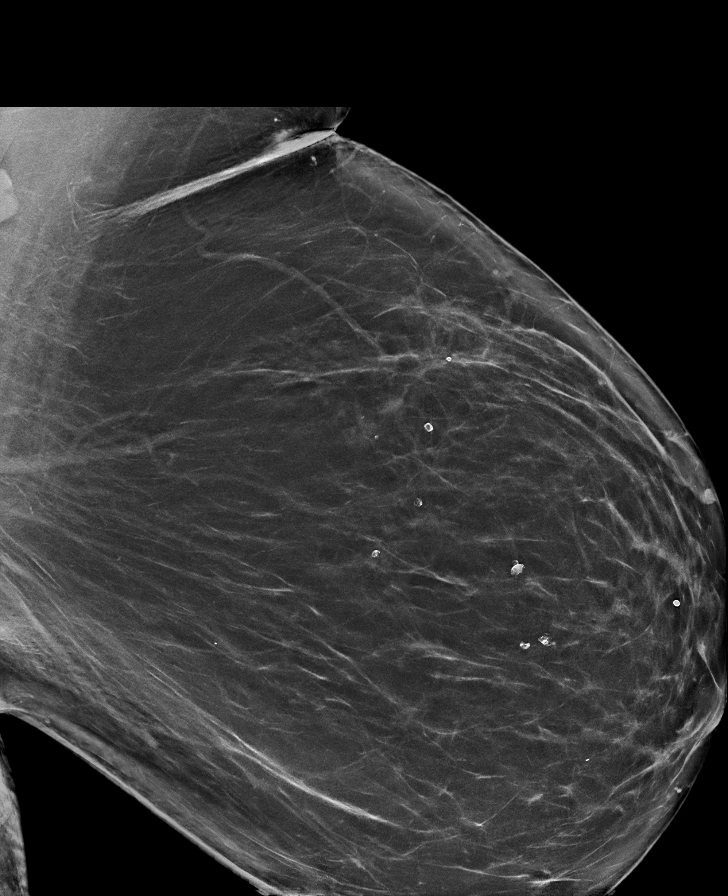

[R CC synth-2D (1 of 2)]
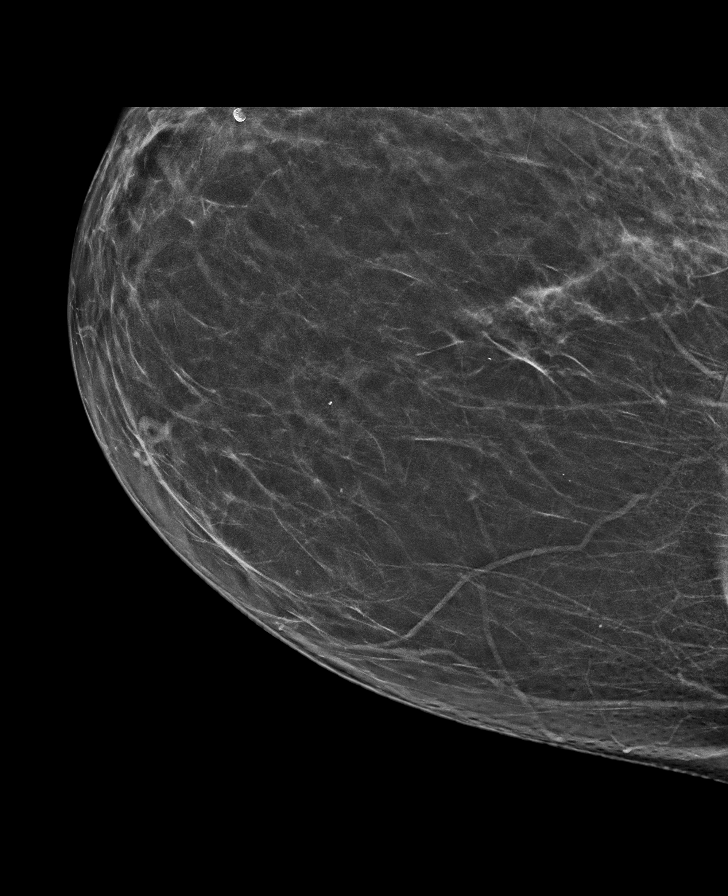

[L CC synth-2D (1 of 2)]
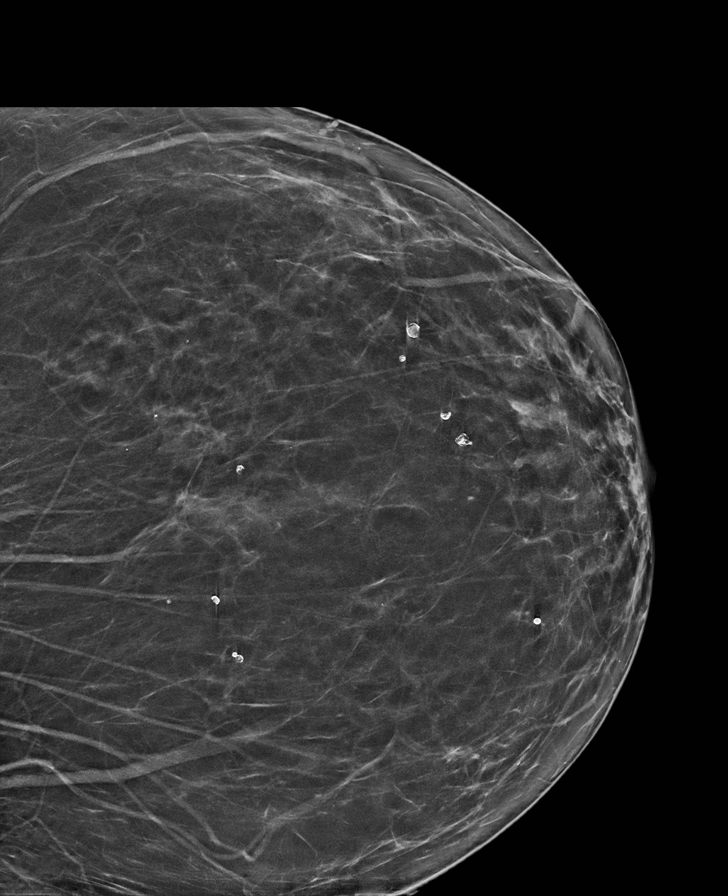

[R MLO synth-2D (1 of 2)]
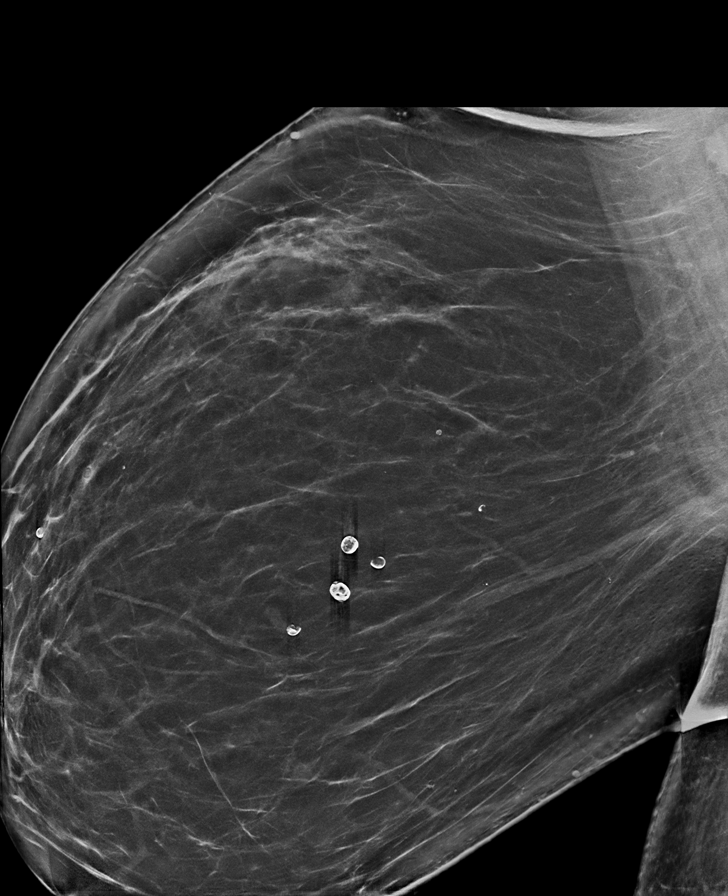

[R MLO synth-2D (2 of 2)]
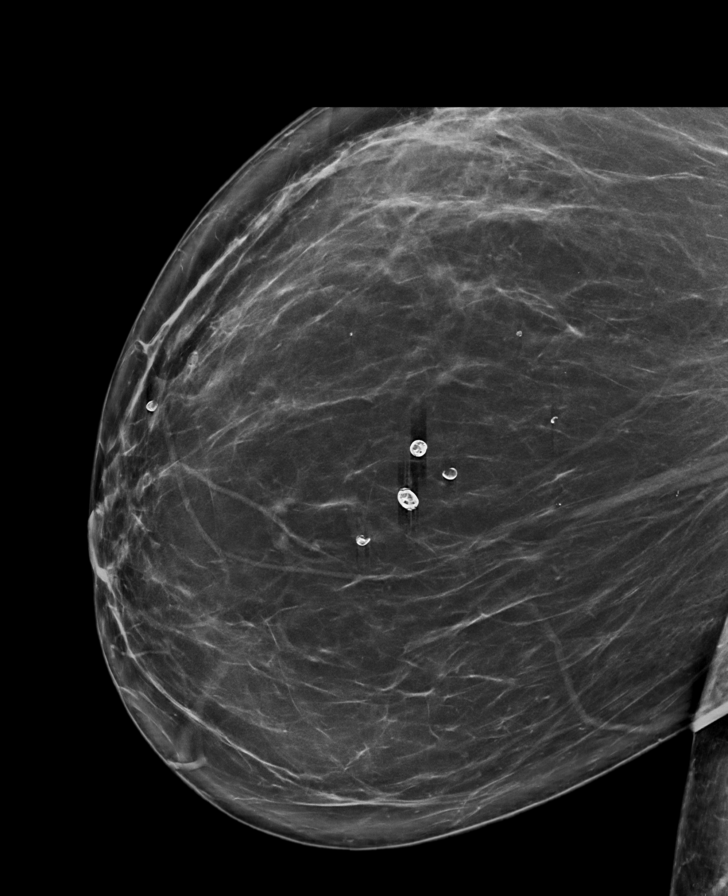

[L CC synth-2D (2 of 2)]
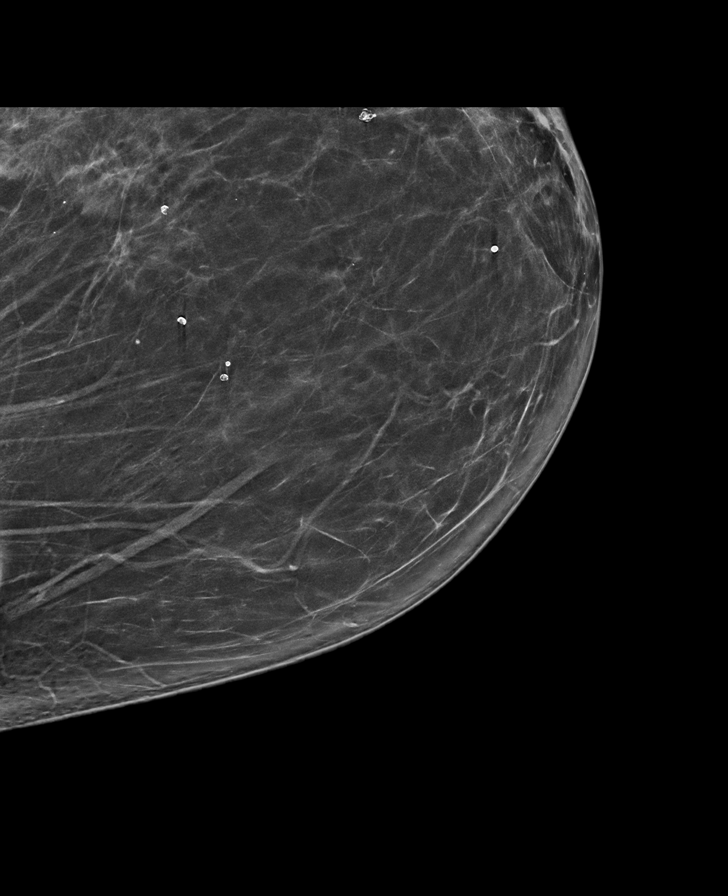

[R CC synth-2D (2 of 2)]
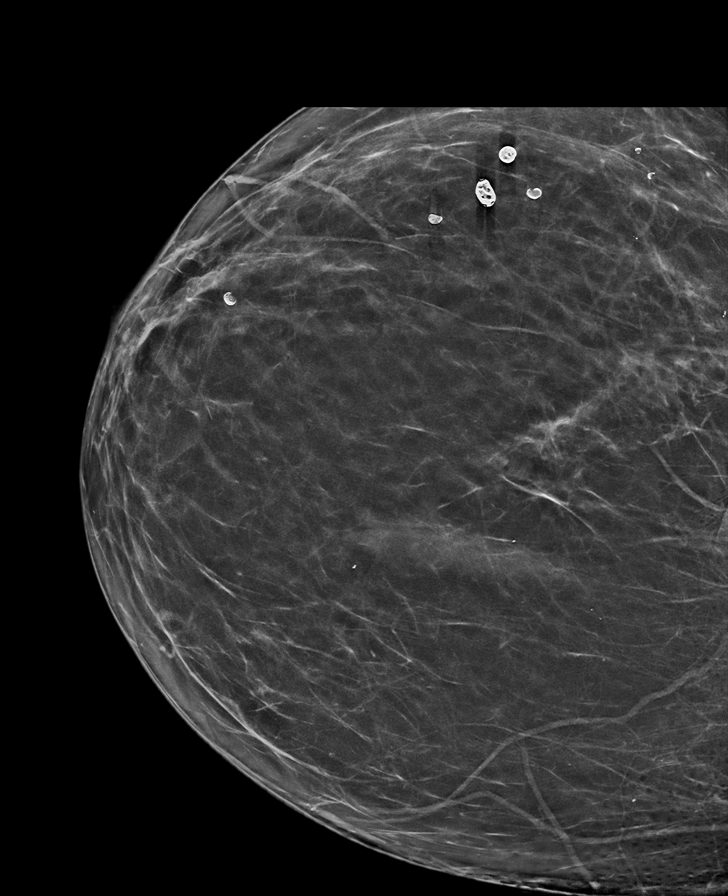

[R MLO tomo · tomo slice 70/103.0]
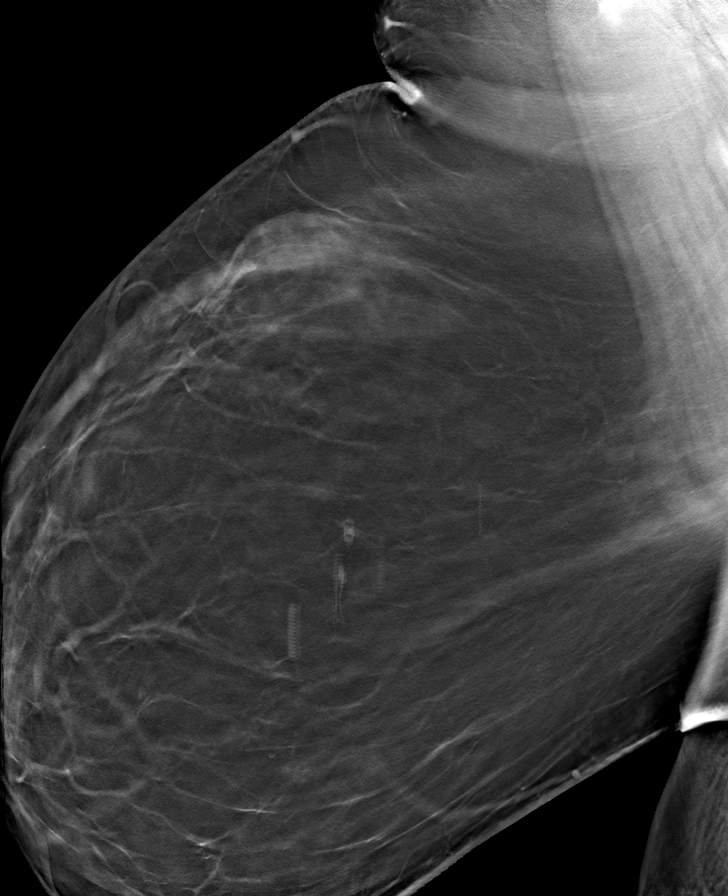

[8 of 40 positions shown; findings below may reference images not displayed]

ACR Breast Density Category b: There are scattered areas of
fibroglandular density.
FINDINGS: There are no findings suspicious for malignancy. Images were
processed with CAD.
IMPRESSION: No mammographic evidence of malignancy. A result letter of this
screening mammogram will be mailed directly to the patient.

RECOMMENDATION:
Screening mammogram in one year. (Code:[id number])

BI-RADS CATEGORY  1: Negative.

## 2021-04-16 ENCOUNTER — Other Ambulatory Visit: Payer: Self-pay | Admitting: Family Medicine

## 2021-04-16 DIAGNOSIS — R748 Abnormal levels of other serum enzymes: Secondary | ICD-10-CM

## 2021-04-24 ENCOUNTER — Ambulatory Visit
Admission: RE | Admit: 2021-04-24 | Discharge: 2021-04-24 | Disposition: A | Discharge status: Home / Self Care | Payer: BC Managed Care – PPO | Source: Ambulatory Visit | Attending: Family Medicine | Admitting: Family Medicine

## 2021-04-24 DIAGNOSIS — R748 Abnormal levels of other serum enzymes: Secondary | ICD-10-CM

## 2021-07-24 ENCOUNTER — Other Ambulatory Visit: Payer: Self-pay

## 2021-07-24 ENCOUNTER — Emergency Department (HOSPITAL_BASED_OUTPATIENT_CLINIC_OR_DEPARTMENT_OTHER)
Admission: EM | Admit: 2021-07-24 | Discharge: 2021-07-24 | Disposition: A | Discharge status: Home / Self Care | Payer: BC Managed Care – PPO | Attending: Emergency Medicine | Admitting: Emergency Medicine

## 2021-07-24 ENCOUNTER — Encounter (HOSPITAL_BASED_OUTPATIENT_CLINIC_OR_DEPARTMENT_OTHER): Payer: Self-pay | Admitting: Emergency Medicine

## 2021-07-24 DIAGNOSIS — Z5321 Procedure and treatment not carried out due to patient leaving prior to being seen by health care provider: Secondary | ICD-10-CM | POA: Diagnosis not present

## 2021-07-24 DIAGNOSIS — M436 Torticollis: Secondary | ICD-10-CM | POA: Diagnosis not present

## 2021-07-24 DIAGNOSIS — M542 Cervicalgia: Secondary | ICD-10-CM | POA: Diagnosis present

## 2021-07-24 DIAGNOSIS — R202 Paresthesia of skin: Secondary | ICD-10-CM | POA: Diagnosis not present

## 2021-07-24 NOTE — ED Triage Notes (Signed)
Pt arrives to ED with c/o neck pain. This started yesterday after pt woke up. Associated symptoms include bilateral hand tingling. She reports that the bilateral hand tingling is common for her and happens a lot, however has been worse lately.

## 2022-04-25 ENCOUNTER — Other Ambulatory Visit: Payer: Self-pay | Admitting: Family Medicine

## 2022-04-25 DIAGNOSIS — Z1231 Encounter for screening mammogram for malignant neoplasm of breast: Secondary | ICD-10-CM

## 2022-05-15 ENCOUNTER — Ambulatory Visit
Admission: RE | Admit: 2022-05-15 | Discharge: 2022-05-15 | Disposition: A | Discharge status: Home / Self Care | Payer: BC Managed Care – PPO | Source: Ambulatory Visit | Attending: Family Medicine | Admitting: Family Medicine

## 2022-05-15 DIAGNOSIS — Z1231 Encounter for screening mammogram for malignant neoplasm of breast: Secondary | ICD-10-CM

## 2023-03-23 ENCOUNTER — Emergency Department (HOSPITAL_BASED_OUTPATIENT_CLINIC_OR_DEPARTMENT_OTHER)
Admission: EM | Admit: 2023-03-23 | Discharge: 2023-03-23 | Disposition: A | Discharge status: Home / Self Care | Payer: BC Managed Care – PPO | Source: Home / Self Care | Attending: Emergency Medicine | Admitting: Emergency Medicine

## 2023-03-23 ENCOUNTER — Encounter (HOSPITAL_BASED_OUTPATIENT_CLINIC_OR_DEPARTMENT_OTHER): Payer: Self-pay | Admitting: Emergency Medicine

## 2023-03-23 ENCOUNTER — Other Ambulatory Visit: Payer: Self-pay

## 2023-03-23 ENCOUNTER — Emergency Department (HOSPITAL_BASED_OUTPATIENT_CLINIC_OR_DEPARTMENT_OTHER): Payer: BC Managed Care – PPO

## 2023-03-23 DIAGNOSIS — E1165 Type 2 diabetes mellitus with hyperglycemia: Secondary | ICD-10-CM | POA: Insufficient documentation

## 2023-03-23 DIAGNOSIS — I10 Essential (primary) hypertension: Secondary | ICD-10-CM | POA: Diagnosis not present

## 2023-03-23 DIAGNOSIS — Z7982 Long term (current) use of aspirin: Secondary | ICD-10-CM | POA: Diagnosis not present

## 2023-03-23 DIAGNOSIS — Z79899 Other long term (current) drug therapy: Secondary | ICD-10-CM | POA: Diagnosis not present

## 2023-03-23 DIAGNOSIS — R42 Dizziness and giddiness: Secondary | ICD-10-CM | POA: Diagnosis present

## 2023-03-23 DIAGNOSIS — R739 Hyperglycemia, unspecified: Secondary | ICD-10-CM

## 2023-03-23 LAB — BASIC METABOLIC PANEL
Anion gap: 9 (ref 5–15)
BUN: 19 mg/dL (ref 8–23)
CO2: 28 mmol/L (ref 22–32)
Calcium: 9.4 mg/dL (ref 8.9–10.3)
Chloride: 100 mmol/L (ref 98–111)
Creatinine, Ser: 0.81 mg/dL (ref 0.44–1.00)
GFR, Estimated: >60 mL/min (ref >60)
Glucose, Bld: 218 mg/dL — ABNORMAL HIGH (ref 70–99)
Potassium: 3.5 mmol/L (ref 3.5–5.1)
Sodium: 137 mmol/L (ref 135–145)

## 2023-03-23 LAB — CBC
HCT: 40.7 % (ref 36.0–46.0)
Hemoglobin: 13.7 g/dL (ref 12.0–15.0)
MCH: 28.8 pg (ref 26.0–34.0)
MCHC: 33.7 g/dL (ref 30.0–36.0)
MCV: 85.5 fL (ref 80.0–100.0)
Platelets: 270 10*3/uL (ref 150–400)
RBC: 4.76 MIL/uL (ref 3.87–5.11)
RDW: 13.1 % (ref 11.5–15.5)
WBC: 7 10*3/uL (ref 4.0–10.5)
nRBC: 0 % (ref 0.0–0.2)

## 2023-03-23 NOTE — ED Provider Notes (Addendum)
[place] EMERGENCY DEPARTMENT AT [facility] Provider Note   CSN: [id number] Arrival date & time: 03/23/23  1229     History  Chief Complaint  Patient presents with   Dizziness    Michaela Kennedy is a 62 y.o. female.  Patient with a complaint of intermittent dizziness for over a week.  Does not include room spinning.  The episodes are very brief less than 5 minutes sometimes only a minute.  Patient denies any headache speech problems visual problems numbness or weakness in the extremities.  Patient does have a history of diabetes has been off her metformin.  Knows that her blood sugars probably been high for a few months.  Her primary had put her on metformin but it did not make her feel very good so she stopped it.  Patient denies any shortness of breath or any chest pain.  Denies any fevers or any dysuria.  In addition no abdominal pain.  Past medical history significant for hypertension obesity and gastroesophageal reflux disease and has had a gastric banding and the diabetes.       Home Medications Prior to Admission medications   Medication Sig Start Date End Date Taking? Authorizing Provider  ALPRAZolam Prudy Feeler) 0.25 MG tablet Take 0.25 mg by mouth daily as needed for anxiety. 08/12/17   [provider]  amLODipine (NORVASC) 5 MG tablet Take 5 mg by mouth daily.      [provider]  aspirin EC 81 MG tablet Take 81 mg by mouth daily.    [provider]  bisoprolol-hydrochlorothiazide (ZIAC) 10-6.25 MG per tablet Take 1 tablet by mouth daily.    [provider]  cyclobenzaprine (FLEXERIL) 10 MG tablet Take 10 mg by mouth 2 (two) times daily as needed for muscle spasms.     [provider]  dexlansoprazole (DEXILANT) 60 MG capsule Take 60 mg by mouth daily.    [provider]  hydrochlorothiazide 25 MG tablet Take 25 mg by mouth daily.      [provider]  valACYclovir (VALTREX) 1000 MG tablet Take 2 g by  mouth 2 (two) times daily as needed (for out breaks).  08/10/17   [provider]      Allergies    Lisinopril and Hydrocodone-acetaminophen    Review of Systems   Review of Systems  Constitutional:  Negative for chills and fever.  HENT:  Negative for ear pain and sore throat.   Eyes:  Negative for pain and visual disturbance.  Respiratory:  Negative for cough and shortness of breath.   Cardiovascular:  Negative for chest pain and palpitations.  Gastrointestinal:  Negative for abdominal pain, diarrhea, nausea and vomiting.  Genitourinary:  Negative for dysuria and hematuria.  Musculoskeletal:  Negative for arthralgias and back pain.  Skin:  Negative for color change and rash.  Neurological:  Positive for dizziness. Negative for seizures, syncope, facial asymmetry, speech difficulty, weakness, light-headedness, numbness and headaches.  All other systems reviewed and are negative.   Physical Exam Updated Vital Signs BP (!) 146/100   Pulse 61   Temp 98.3 F (36.8 C) (Oral)   Resp 18   Ht 1.6 m (5\' 3" )   Wt 108.9 kg   LMP 01/19/2011   SpO2 97%   BMI 42.51 kg/m  Physical Exam Vitals and nursing note reviewed.  Constitutional:      General: She is not in acute distress.    Appearance: Normal appearance. She is well-developed.  HENT:  Head: Normocephalic and atraumatic.  Eyes:     Extraocular Movements: Extraocular movements intact.     Conjunctiva/sclera: Conjunctivae normal.     Pupils: Pupils are equal, round, and reactive to light.  Cardiovascular:     Rate and Rhythm: Normal rate and regular rhythm.     Heart sounds: No murmur heard. Pulmonary:     Effort: Pulmonary effort is normal. No respiratory distress.     Breath sounds: Normal breath sounds.  Abdominal:     Palpations: Abdomen is soft.     Tenderness: There is no abdominal tenderness.  Musculoskeletal:        General: No swelling.     Cervical back: Neck supple.  Skin:    General: Skin is  warm and dry.     Capillary Refill: Capillary refill takes less than 2 seconds.  Neurological:     General: No focal deficit present.     Mental Status: She is alert and oriented to person, place, and time.     Cranial Nerves: No cranial nerve deficit.     Sensory: No sensory deficit.     Motor: No weakness.  Psychiatric:        Mood and Affect: Mood normal.     ED Results / Procedures / Treatments   Labs (all labs ordered are listed, but only abnormal results are displayed) Labs Reviewed  BASIC METABOLIC PANEL - Abnormal; Notable for the following components:      Result Value   Glucose, Bld 218 (*)    All other components within normal limits  CBC    EKG EKG Interpretation Date/Time:  Monday March 23 2023 12:38:44 EDT Ventricular Rate:  66 PR Interval:  150 QRS Duration:  90 QT Interval:  452 QTC Calculation: 473 R Axis:   1  Text Interpretation: Normal sinus rhythm Normal ECG When compared with ECG of 24-Jul-2021 10:16, No significant change was found Confirmed by Vanetta Mulders [phone]) on 03/23/2023 3:14:22 PM  Radiology No results found.  Procedures Procedures    Medications Ordered in ED Medications - No data to display  ED Course/ Medical Decision Making/ A&P                                 Medical Decision Making Amount and/or Complexity of Data Reviewed Labs: ordered. Radiology: ordered.   Patient nontoxic no acute distress.  Patient's labs here blood sugar 218 but CO2 is good at 28 electrolytes normal.  Renal function normal.  CBC no leukocytosis hemoglobin 10.7.  EKG without any acute findings.  I wanted to do CT head just to make sure no evidence of a small stroke symptoms been ongoing for over a week so there was a good chance that would show something.  If negative with been going on for a week.  Him.  Do not need to go in acutely for MRI today.  Patient initially agreed to do the head CT and then changed her mind when they came to get  her.  Patient aware that the workup not complete and standard of care would have included head CT.  Patient will follow-up with her primary care doctor she will return for any new or worse symptoms.  Neuroexam evaluation here without any significant deficits.  Patient asymptomatic no dizziness currently. Final Clinical Impression(s) / ED Diagnoses Final diagnoses:  Dizziness  Hyperglycemia    Rx / DC Orders ED Discharge Orders  None         Vanetta Mulders, MD 03/23/23 1724    Vanetta Mulders, MD 03/23/23 1725

## 2023-03-23 NOTE — Discharge Instructions (Addendum)
As we discussed standard of care would be to do head CT to further evaluate the intermittent dizziness.  If negative then MRI would be indicated.  We understand that you do not want the head CT.  Make an appointment to follow-up with your primary care doctor as well as [facility] if symptoms do not resolve.  Return for any new or worse symptoms.  Recommend starting your metformin back for the elevated blood sugar.  Return for any new or worse symptoms.

## 2023-03-23 NOTE — ED Triage Notes (Signed)
Pt arrives to ED with c/o intermittent dizziness x1 week that last roughly less than 5 minutes.

## 2023-03-23 NOTE — ED Notes (Signed)
Pt refusing CT scan, saying her dizziness has resolved. Pt requests discharge. MD notified.

## 2024-05-03 ENCOUNTER — Emergency Department (HOSPITAL_BASED_OUTPATIENT_CLINIC_OR_DEPARTMENT_OTHER)

## 2024-05-03 ENCOUNTER — Emergency Department (HOSPITAL_BASED_OUTPATIENT_CLINIC_OR_DEPARTMENT_OTHER)
Admission: EM | Admit: 2024-05-03 | Discharge: 2024-05-03 | Disposition: A | Discharge status: Home / Self Care | Attending: Emergency Medicine | Admitting: Emergency Medicine

## 2024-05-03 ENCOUNTER — Other Ambulatory Visit: Payer: Self-pay

## 2024-05-03 ENCOUNTER — Encounter (HOSPITAL_BASED_OUTPATIENT_CLINIC_OR_DEPARTMENT_OTHER): Payer: Self-pay | Admitting: Emergency Medicine

## 2024-05-03 DIAGNOSIS — I10 Essential (primary) hypertension: Secondary | ICD-10-CM | POA: Insufficient documentation

## 2024-05-03 DIAGNOSIS — R42 Dizziness and giddiness: Secondary | ICD-10-CM

## 2024-05-03 DIAGNOSIS — E119 Type 2 diabetes mellitus without complications: Secondary | ICD-10-CM | POA: Diagnosis not present

## 2024-05-03 DIAGNOSIS — Z7984 Long term (current) use of oral hypoglycemic drugs: Secondary | ICD-10-CM | POA: Insufficient documentation

## 2024-05-03 DIAGNOSIS — Z7982 Long term (current) use of aspirin: Secondary | ICD-10-CM | POA: Diagnosis not present

## 2024-05-03 DIAGNOSIS — R202 Paresthesia of skin: Secondary | ICD-10-CM | POA: Diagnosis present

## 2024-05-03 DIAGNOSIS — Z79899 Other long term (current) drug therapy: Secondary | ICD-10-CM | POA: Diagnosis not present

## 2024-05-03 LAB — BASIC METABOLIC PANEL WITH GFR
Anion gap: 13 (ref 5–15)
BUN: 18 mg/dL (ref 8–23)
CO2: 24 mmol/L (ref 22–32)
Calcium: 9.6 mg/dL (ref 8.9–10.3)
Chloride: 100 mmol/L (ref 98–111)
Creatinine, Ser: 0.66 mg/dL (ref 0.44–1.00)
GFR, Estimated: >60 mL/min (ref >60)
Glucose, Bld: 105 mg/dL — ABNORMAL HIGH (ref 70–99)
Potassium: 4 mmol/L (ref 3.5–5.1)
Sodium: 138 mmol/L (ref 135–145)

## 2024-05-03 LAB — CBC
HCT: 42.8 % (ref 36.0–46.0)
Hemoglobin: 14.3 g/dL (ref 12.0–15.0)
MCH: 29.1 pg (ref 26.0–34.0)
MCHC: 33.4 g/dL (ref 30.0–36.0)
MCV: 87.2 fL (ref 80.0–100.0)
Platelets: 256 K/uL (ref 150–400)
RBC: 4.91 MIL/uL (ref 3.87–5.11)
RDW: 13.4 % (ref 11.5–15.5)
WBC: 5.6 K/uL (ref 4.0–10.5)
nRBC: 0 % (ref 0.0–0.2)

## 2024-05-03 LAB — TROPONIN T, HIGH SENSITIVITY
Troponin T High Sensitivity: <15 ng/L (ref 0–19)
Troponin T High Sensitivity: <15 ng/L (ref 0–19)

## 2024-05-03 LAB — DIFFERENTIAL
Abs Immature Granulocytes: 0.02 K/uL (ref 0.00–0.07)
Basophils Absolute: 0.1 K/uL (ref 0.0–0.1)
Basophils Relative: 1 %
Eosinophils Absolute: 0.2 K/uL (ref 0.0–0.5)
Eosinophils Relative: 3 %
Immature Granulocytes: 0 %
Lymphocytes Relative: 30 %
Lymphs Abs: 1.7 K/uL (ref 0.7–4.0)
Monocytes Absolute: 0.4 K/uL (ref 0.1–1.0)
Monocytes Relative: 8 %
Neutro Abs: 3.2 K/uL (ref 1.7–7.7)
Neutrophils Relative %: 58 %

## 2024-05-03 NOTE — ED Provider Notes (Signed)
 [place] EMERGENCY DEPARTMENT AT [facility] Provider Note   CSN: [id number] Arrival date & time: 05/03/24  1028     Patient presents with: Numbness   Michaela Kennedy is a 63 y.o. female.   HPI    Patient comes in with chief complaint of left-sided arm numbness, tingling that started at 930 this morning.  Patient denies any associated slurred speech, nausea, vomiting, chest pain.  Left-sided numbness is located in the upper extremity, between elbow and hand. Patient has also experienced some  left-sided shoulder discomfort.  Patient has history of elevated BMI. Prior to Admission medications   Medication Sig Start Date End Date Taking? Authorizing Provider  cyclobenzaprine (FLEXERIL) 5 MG tablet Take 5 mg by mouth daily as needed for muscle spasms. 06/12/22  Yes [provider]  rosuvastatin (CRESTOR) 10 MG tablet Take 10 mg by mouth daily. 04/20/24  Yes [provider]  triamcinolone (KENALOG) 0.1 % paste Use as directed 1 Application in the mouth or throat 2 (two) times daily. 04/27/24  Yes [provider]  triamcinolone cream (KENALOG) 0.1 % Apply 1 Application topically 2 (two) times daily. 04/27/24  Yes [provider]  albuterol (VENTOLIN HFA) 108 (90 Base) MCG/ACT inhaler Inhale 2 puffs into the lungs every 6 (six) hours as needed.    [provider]  ALPRAZolam (XANAX) 0.25 MG tablet Take 0.25 mg by mouth daily as needed for anxiety. 08/12/17   [provider]  amLODipine (NORVASC) 5 MG tablet Take 5 mg by mouth daily.      [provider]  aspirin EC 81 MG tablet Take 81 mg by mouth daily.    [provider]  bisoprolol-hydrochlorothiazide  (ZIAC) 10-6.25 MG per tablet Take 1 tablet by mouth daily.    [provider]  cyclobenzaprine (FLEXERIL) 10 MG tablet Take 10 mg by mouth 2 (two) times daily as needed for muscle spasms.     [provider]  dexlansoprazole (DEXILANT) 60 MG  capsule Take 60 mg by mouth daily.    [provider]  fluticasone (FLONASE) 50 MCG/ACT nasal spray Place 2 sprays into both nostrils daily.    [provider]  hydrochlorothiazide  25 MG tablet Take 25 mg by mouth daily.      [provider]  metFORMIN (GLUCOPHAGE-XR) 500 MG 24 hr tablet Take 500 mg by mouth every morning.    [provider]  MOUNJARO 15 MG/0.5ML Pen Inject 15 mg into the skin once a week.    [provider]  valACYclovir (VALTREX) 1000 MG tablet Take 2 g by mouth 2 (two) times daily as needed (for out breaks).  08/10/17   [provider]  Vitamin D, Ergocalciferol, (DRISDOL) 1.25 MG (50000 UNIT) CAPS capsule Take 50,000 Units by mouth once a week.    [provider]    Allergies: Lisinopril and Hydrocodone-acetaminophen     Review of Systems  All other systems reviewed and are negative.   Updated Vital Signs BP (!) 164/91 (BP Location: Left Arm)   Pulse 65   Temp 97.7 F (36.5 C) (Oral)   Resp 14   LMP 01/19/2011   SpO2 98%   Physical Exam Vitals and nursing note reviewed.  Constitutional:      Appearance: She is well-developed.  HENT:     Head: Atraumatic.  Cardiovascular:     Rate and Rhythm: Normal rate.  Pulmonary:     Effort: Pulmonary effort is normal.  Musculoskeletal:  Cervical back: Normal range of motion and neck supple.  Skin:    General: Skin is warm and dry.  Neurological:     Mental Status: She is alert and oriented to person, place, and time.     Cranial Nerves: No cranial nerve deficit.     Sensory: Sensory deficit present.     Motor: No weakness.     Coordination: Coordination normal.     Comments: Subjective paresthesia to the distal left upper extremity and hand     (all labs ordered are listed, but only abnormal results are displayed) Labs Reviewed  BASIC METABOLIC PANEL WITH GFR - Abnormal; Notable for the following components:      Result Value   Glucose, Bld  105 (*)    All other components within normal limits  CBC  DIFFERENTIAL  TROPONIN T, HIGH SENSITIVITY  TROPONIN T, HIGH SENSITIVITY    EKG: EKG Interpretation Date/Time:  Tuesday May 03 2024 10:37:20 EDT Ventricular Rate:  64 PR Interval:  178 QRS Duration:  90 QT Interval:  440 QTC Calculation: 453 R Axis:   14  Text Interpretation: Normal sinus rhythm Normal ECG When compared with ECG of 23-Mar-2023 12:38, No significant change was found No acute changes No significant change since last tracing Confirmed by Charlyn Sora [phone]) on 05/03/2024 3:13:00 PM  Radiology: CT HEAD WO CONTRAST Result Date: 05/03/2024 CLINICAL DATA:  Dizziness, left arm numbness and tingling EXAM: CT HEAD WITHOUT CONTRAST TECHNIQUE: Contiguous axial images were obtained from the base of the skull through the vertex without intravenous contrast. RADIATION DOSE REDUCTION: This exam was performed according to the departmental dose-optimization program which includes automated exposure control, adjustment of the mA and/or kV according to patient size and/or use of iterative reconstruction technique. COMPARISON:  None Available. FINDINGS: CT HEAD: Attenuation in the brain parenchyma is normal. There is no hemorrhage. No acute ischemic changes. No mass lesion. The ventricles are normal. Skull/sinuses/orbits: No significant abnormality. IMPRESSION: Normal Electronically Signed   By: Nancyann Burns M.D.   On: 05/03/2024 16:11     Procedures   Medications Ordered in the ED - No data to display                                  Medical Decision Making Amount and/or Complexity of Data Reviewed Labs: ordered. Radiology: ordered.   Patient comes in with chief complaint of left-sided upper extremity numbness. Patient has elevated BMI history, diabetes and hypertension. No history of stroke, CAD.  On exam, patient has numbness to just part of the left upper extremity.  She also has some left shoulder  discomfort.  Differential considered for this patient includes brachial plexopathy, impingement syndrome, TIA, stroke, ACS.  Current exam is very much reassuring. Basic labs, CT scan, troponins ordered. If reassuring, will discharge with outpatient follow-up.  Final diagnoses:  Paresthesia and pain of left extremity    ED Discharge Orders     None          Charlyn Sora, MD 05/07/24 2231

## 2024-05-03 NOTE — Discharge Instructions (Signed)
 We saw you in the ER for the left arm numbness and left arm pain. All of our cardiac workup is normal, including labs, EKG and chest X-RAY are normal. CT scan of your brain is also normal.  We are not sure what is causing your discomfort, but we feel comfortable sending you home at this time. The workup in the ER is not complete, and you should follow up with your primary care doctor for further evaluation.  Please start taking baby aspirin every day.  Return to the emergency room if you start having severe chest pain, arm pain, shortness of breath, sudden one-sided weakness, numbness, slurring of your speech or vision loss.

## 2024-05-03 NOTE — ED Notes (Signed)
 Reviewed AVS/discharge instruction with patient. Time allotted for and all questions answered. Patient is agreeable for d/c and escorted to ed exit by staff.

## 2024-05-03 NOTE — ED Triage Notes (Addendum)
 C/o left arm numbness and tingling starting around 0930 this morning. Dizziness. Denies CP.   Took (2) 81 mg asa pta.

## 2024-05-03 NOTE — ED Notes (Signed)
 Patient transported to CT

## 2024-05-03 NOTE — ED Notes (Signed)
MD in triage.

## 2024-07-11 ENCOUNTER — Other Ambulatory Visit: Payer: Self-pay | Admitting: Family Medicine

## 2024-07-11 DIAGNOSIS — Z1231 Encounter for screening mammogram for malignant neoplasm of breast: Secondary | ICD-10-CM

## 2024-07-13 ENCOUNTER — Inpatient Hospital Stay: Admission: RE | Admit: 2024-07-13 | Discharge: 2024-07-13 | Attending: Family Medicine | Admitting: Family Medicine

## 2024-07-13 DIAGNOSIS — Z1231 Encounter for screening mammogram for malignant neoplasm of breast: Secondary | ICD-10-CM
